# Patient Record
Sex: Female | Born: 1935 | Race: White | Hispanic: No | Smoking: Never smoker
Health system: Southern US, Community
[De-identification: ages and names within clinical notes are randomized; demographics above are authoritative.]

## PROBLEM LIST (undated history)

## (undated) DIAGNOSIS — N950 Postmenopausal bleeding: Secondary | ICD-10-CM

## (undated) DIAGNOSIS — E785 Hyperlipidemia, unspecified: Secondary | ICD-10-CM

## (undated) DIAGNOSIS — C50919 Malignant neoplasm of unspecified site of unspecified female breast: Secondary | ICD-10-CM

## (undated) DIAGNOSIS — Q159 Congenital malformation of eye, unspecified: Secondary | ICD-10-CM

## (undated) DIAGNOSIS — IMO0001 Reserved for inherently not codable concepts without codable children: Secondary | ICD-10-CM

## (undated) DIAGNOSIS — I4891 Unspecified atrial fibrillation: Secondary | ICD-10-CM

## (undated) DIAGNOSIS — Z9221 Personal history of antineoplastic chemotherapy: Secondary | ICD-10-CM

## (undated) DIAGNOSIS — C55 Malignant neoplasm of uterus, part unspecified: Secondary | ICD-10-CM

## (undated) DIAGNOSIS — I10 Essential (primary) hypertension: Secondary | ICD-10-CM

## (undated) DIAGNOSIS — E079 Disorder of thyroid, unspecified: Secondary | ICD-10-CM

## (undated) DIAGNOSIS — IMO0002 Reserved for concepts with insufficient information to code with codable children: Secondary | ICD-10-CM

## (undated) HISTORY — DX: Hyperlipidemia, unspecified: E78.5

## (undated) HISTORY — DX: Essential (primary) hypertension: I10

## (undated) HISTORY — DX: Malignant neoplasm of uterus, part unspecified: C55

## (undated) HISTORY — DX: Malignant neoplasm of unspecified site of unspecified female breast: C50.919

## (undated) HISTORY — PX: ABDOMINAL HYSTERECTOMY: SHX81

## (undated) HISTORY — DX: Disorder of thyroid, unspecified: E07.9

## (undated) HISTORY — DX: Postmenopausal bleeding: N95.0

---

## 1964-07-10 HISTORY — PX: TUBAL LIGATION: SHX77

## 1964-07-10 HISTORY — PX: APPENDECTOMY: SHX54

## 1985-07-10 HISTORY — PX: CATARACT EXTRACTION: SUR2

## 1998-07-10 HISTORY — PX: RETINAL DETACHMENT SURGERY: SHX105

## 1999-07-11 HISTORY — PX: BLADDER REPAIR: SHX76

## 2000-07-10 HISTORY — PX: MASTECTOMY: SHX3

## 2000-07-10 HISTORY — PX: CATARACT EXTRACTION: SUR2

## 2011-09-26 ENCOUNTER — Encounter: Payer: Self-pay | Admitting: Gynecologic Oncology

## 2011-09-27 ENCOUNTER — Encounter: Payer: Self-pay | Admitting: Gynecologic Oncology

## 2011-09-27 ENCOUNTER — Ambulatory Visit: Payer: Medicare Other | Attending: Gynecologic Oncology | Admitting: Gynecologic Oncology

## 2011-09-27 VITALS — BP 142/64 | HR 66 | Temp 98.1°F | Resp 16 | Ht 63.62 in | Wt 147.7 lb

## 2011-09-27 DIAGNOSIS — E079 Disorder of thyroid, unspecified: Secondary | ICD-10-CM | POA: Insufficient documentation

## 2011-09-27 DIAGNOSIS — C549 Malignant neoplasm of corpus uteri, unspecified: Secondary | ICD-10-CM | POA: Insufficient documentation

## 2011-09-27 DIAGNOSIS — Z9010 Acquired absence of unspecified breast and nipple: Secondary | ICD-10-CM | POA: Insufficient documentation

## 2011-09-27 DIAGNOSIS — Z7982 Long term (current) use of aspirin: Secondary | ICD-10-CM | POA: Insufficient documentation

## 2011-09-27 DIAGNOSIS — M810 Age-related osteoporosis without current pathological fracture: Secondary | ICD-10-CM | POA: Insufficient documentation

## 2011-09-27 DIAGNOSIS — C541 Malignant neoplasm of endometrium: Secondary | ICD-10-CM

## 2011-09-27 DIAGNOSIS — Z79899 Other long term (current) drug therapy: Secondary | ICD-10-CM | POA: Insufficient documentation

## 2011-09-27 DIAGNOSIS — I10 Essential (primary) hypertension: Secondary | ICD-10-CM | POA: Insufficient documentation

## 2011-09-27 DIAGNOSIS — E785 Hyperlipidemia, unspecified: Secondary | ICD-10-CM | POA: Insufficient documentation

## 2011-09-27 DIAGNOSIS — Z853 Personal history of malignant neoplasm of breast: Secondary | ICD-10-CM | POA: Insufficient documentation

## 2011-09-27 DIAGNOSIS — C55 Malignant neoplasm of uterus, part unspecified: Secondary | ICD-10-CM | POA: Insufficient documentation

## 2011-09-27 NOTE — Progress Notes (Signed)
Consult Note: Gyn-Onc  Kaylee Pugh 78 y.o. female  CC:  Chief Complaint  Patient presents with  . Endometrial Adenocarcinoma    New pt    HPI: Patient is seen today in consultation at the request of Dr. Baldemar Lenis. Patient is an 78 year old gravida 4 para 4 who began having some bleeding December 23 of the year 2012. She stated at that time that the bleeding was intermittent. She finally sought medical attention for this February 26. An endometrial biopsy was done in the office revealed endometrial cancer. However the specimen was scant and fragmented and they were not able to give her final classification or grating. It is for this reason she is referred to [place] today.  She is otherwise in her usual state of health. She's had significant issues with low back pain. She's had 2 epidural injections for her back pain it it is better. The back pain that she was having a cause radiation of pain down her left leg to the level of her toes. As stated this is better now since an epidural injection. For 3 days she's had some left groin discomfort has been intermittent. She thinks it is positional. She is not sure at what age she transitioned through menopause. She's never taken hormones.  Interval History:   Review of Systems: She denies any chest pain. She does have shortness of breath going up a long flight of stairs but can easily do it. She occasionally has to rest at the top of stairs. She can complete all of her activities of daily living and is quite independent. She denies any nausea,, fevers, chills, headaches, visual changes. She had an intentional weight loss or weight gain. She denies any change in her bowel or bladder habits.   Current Meds:  Outpatient Encounter Prescriptions as of 09/27/2011  Medication Sig Dispense Refill  . aspirin 81 MG tablet Take 81 mg by mouth daily.      . Calcium Carbonate-Vitamin D (CALCIUM + D) 600-200 MG-UNIT TABS Take 1 tablet by mouth 3 (three) times daily.        Marland Kitchen esomeprazole (NEXIUM) 40 MG capsule Take 40 mg by mouth daily before breakfast.      . levothyroxine (SYNTHROID, LEVOTHROID) 50 MCG tablet Take 50 mcg by mouth daily.      Marland Kitchen losartan-hydrochlorothiazide (HYZAAR) 50-12.5 MG per tablet Take 1 tablet by mouth daily.      . nebivolol (BYSTOLIC) 10 MG tablet Take 10 mg by mouth daily.      . rosuvastatin (CRESTOR) 5 MG tablet Take 5 mg by mouth daily.        Allergy:  Allergies  Allergen Reactions  . Iodine Swelling and Rash    Iodine contrast    Social Hx:   History   Social History  . Marital Status: Widowed    Spouse Name: N/A    Number of Children: N/A  . Years of Education: N/A   Occupational History  . Not on file.   Social History Main Topics  . Smoking status: Never Smoker   . Smokeless tobacco: Not on file  . Alcohol Use: No  . Drug Use: No  . Sexually Active:    Other Topics Concern  . Not on file   Social History Narrative  . No narrative on file    Past Surgical Hx:  Consistent of a bladder tacking for urinary incontinence. She is a gravida 4 para 4. At mastectomy for an early stage breast cancer in 2002  and did not require any additional chemotherapy radiation or adjuvant hormonal management. Past Surgical History  Procedure Date  . Mastectomy 2002  . Cataract extraction 1987    Left eye  . Cataract extraction 2002    Right eye  . Retinal detachment surgery 2000  . Bladder repair 2001  . Tubal ligation 1966  . Appendectomy 1966    Past Medical Hx:  Past Medical History  Diagnosis Date  . Breast cancer   . Hypertension   . Hyperlipidemia   . Thyroid disease   . Osteoporosis   . Post-menopausal bleeding     Family Hx:  Family History  Problem Relation Age of Onset  . Diabetes Mother   . Heart disease Father   . Kidney disease Sister     Vitals:  Blood pressure 142/64, pulse 66, temperature 98.1 F (36.7 C), resp. rate 16, height 5' 3.62" (1.616 m), weight 147 lb 11.2 oz (66.996  kg).  Physical Exam: Nourished well developed elderly female in no acute distress. Patient appears younger than stated age.  Neck: No lymphadenopathy no thyromegaly.  Lungs: Clear to auscultation bilaterally.  Cardiovascular: Regular rate and rhythm.  Abdomen: Well-healed infraumbilical vertical incision. There is no evidence of an incisional hernia. She is a visible 2 cm umbilical hernia that is not tender and reducible. There are no masses or nodularity.  Groins: No lymphadenopathy.  Extremities: No edema.  Pelvic: External genitalia within normal limits no atrophic. The vagina is markedly atrophic. The cervix is small there are no visible lesions. The cervix is somewhat deviated towards her side. The purposes of normal size shape and consistency. It is deviated towards the right. There is no nodularity. There are no adnexal masses. Rectal confirms. Exam is limited by patient's tolerance.  Assessment/Plan: 78 year old with ungraded unknown histology of endometrial carcinoma. Spent at least 30 minutes face to face time with the patient and her family discussing what needed to be done with regards to hysterectomy. They understand that she'll need a hysterectomy with removal of the uterus, cervix, tubes, and ovaries. They understand that the uterus will be sent for frozen section to determine the need for any additional procedures. We worked through many dates for surgery both at [facility] and here in [place]. Ultimately they would like to have her surgery on March 29 with Dr. Kyla Balzarine at [facility] due to  many family obligations that are upcoming.  They would to have the surgery done robotic clinic she is a fine candidate for robotic surgery. Risks of surgery were discussed with the patient and her family and included but are not limited to injury to surrounding organs, bleeding, infection, and need for additional procedures. We also discussed thromboembolic disease and the possible need for  Lovenox injections should she have a lymphadenectomy. We discussed thromboembolic disease. She would like to have umbilical hernia repaired at the same time and that should not be an issue. She is scheduled for preop visit on March 25 this would at [facility]. The opportunity to see Dr. Kyla Balzarine at that time. Their numerous questions were elicited in answer to their satisfaction. They can contact us if they have any additional questions.  Mikita Lesmeister A., MD 09/27/2011, 12:07 PM

## 2011-09-27 NOTE — Patient Instructions (Signed)
RTC in [place] for pre-op visit.

## 2011-09-28 ENCOUNTER — Encounter: Payer: Self-pay | Admitting: *Deleted

## 2011-09-28 NOTE — Progress Notes (Signed)
Records faxed to [facility] for pre-op 10/02/11 and OR 10/06/11

## 2011-11-06 ENCOUNTER — Telehealth: Payer: Self-pay | Admitting: Oncology

## 2011-11-06 NOTE — Telephone Encounter (Signed)
Referred by Dr. Kyla Balzarine Dx- Endometrial Ca

## 2011-11-09 ENCOUNTER — Encounter: Payer: Self-pay | Admitting: *Deleted

## 2011-11-09 ENCOUNTER — Other Ambulatory Visit: Payer: Medicare Other

## 2011-11-10 ENCOUNTER — Other Ambulatory Visit (HOSPITAL_BASED_OUTPATIENT_CLINIC_OR_DEPARTMENT_OTHER): Payer: Medicare Other | Admitting: Lab

## 2011-11-10 ENCOUNTER — Ambulatory Visit: Payer: Medicare Other

## 2011-11-10 ENCOUNTER — Telehealth: Payer: Self-pay | Admitting: Oncology

## 2011-11-10 ENCOUNTER — Other Ambulatory Visit: Payer: Self-pay | Admitting: Medical Oncology

## 2011-11-10 ENCOUNTER — Ambulatory Visit (HOSPITAL_BASED_OUTPATIENT_CLINIC_OR_DEPARTMENT_OTHER): Payer: Medicare Other | Admitting: Oncology

## 2011-11-10 VITALS — BP 175/92 | HR 82 | Temp 97.5°F | Ht 63.62 in | Wt 145.8 lb

## 2011-11-10 DIAGNOSIS — C55 Malignant neoplasm of uterus, part unspecified: Secondary | ICD-10-CM

## 2011-11-10 DIAGNOSIS — R112 Nausea with vomiting, unspecified: Secondary | ICD-10-CM

## 2011-11-10 DIAGNOSIS — C541 Malignant neoplasm of endometrium: Secondary | ICD-10-CM

## 2011-11-10 DIAGNOSIS — G629 Polyneuropathy, unspecified: Secondary | ICD-10-CM

## 2011-11-10 DIAGNOSIS — C50919 Malignant neoplasm of unspecified site of unspecified female breast: Secondary | ICD-10-CM

## 2011-11-10 DIAGNOSIS — G609 Hereditary and idiopathic neuropathy, unspecified: Secondary | ICD-10-CM

## 2011-11-10 DIAGNOSIS — E222 Syndrome of inappropriate secretion of antidiuretic hormone: Secondary | ICD-10-CM

## 2011-11-10 DIAGNOSIS — F411 Generalized anxiety disorder: Secondary | ICD-10-CM

## 2011-11-10 DIAGNOSIS — E039 Hypothyroidism, unspecified: Secondary | ICD-10-CM

## 2011-11-10 LAB — CBC WITH DIFFERENTIAL/PLATELET
BASO%: 0.3 % (ref 0.0–2.0)
LYMPH%: 16.4 % (ref 14.0–49.7)
MCHC: 32.5 g/dL (ref 31.5–36.0)
MONO#: 0.6 10*3/uL (ref 0.1–0.9)
RBC: 3.82 10*6/uL (ref 3.70–5.45)
WBC: 7.3 10*3/uL (ref 3.9–10.3)
lymph#: 1.2 10*3/uL (ref 0.9–3.3)
nRBC: 0 % (ref 0–0)

## 2011-11-10 LAB — COMPREHENSIVE METABOLIC PANEL
ALT: 13 U/L (ref 0–35)
AST: 20 U/L (ref 0–37)
Alkaline Phosphatase: 67 U/L (ref 39–117)
Calcium: 9.4 mg/dL (ref 8.4–10.5)
Chloride: 102 mEq/L (ref 96–112)
Creatinine, Ser: 0.9 mg/dL (ref 0.50–1.10)
Potassium: 4.4 mEq/L (ref 3.5–5.3)

## 2011-11-10 MED ORDER — LORAZEPAM 0.5 MG PO TABS: 0.5000 mg | ORAL_TABLET | Freq: Four times a day (QID) | ORAL | Status: AC | PRN

## 2011-11-10 MED ORDER — DEXAMETHASONE 4 MG PO TABS: 4.0000 mg | ORAL_TABLET | Freq: Two times a day (BID) | ORAL | Status: AC

## 2011-11-10 MED ORDER — ONDANSETRON HCL 8 MG PO TABS: 8.0000 mg | ORAL_TABLET | Freq: Two times a day (BID) | ORAL | Status: AC | PRN

## 2011-11-10 NOTE — Patient Instructions (Addendum)
First chemotherapy with taxotere/ carboplatin will be Wednesday May 15. On Tuesday May 14, you should take decadron (dexamethasone, steroid) two of the 4 mg tablets (= 8 mg) in AM with food and two of the 4 mg tablets (=8 mg) late afternoon with food. On Wed May 15 and on Thurs May 16, you should take decadron two tablets each AM and two tablets late afternoon, each dose with food.  (The steroids might make you more energetic, hungrier, more talkative, sometimes more anxious, sometimes harder to sleep, may help arthritis. Try not to be extremely active even if you want to! Often people are more tired for a couple of days when they stop the steroids.)  On Wednesday night, whether or not you are nauseated, take ativan (lorazepam) at bedtime. This may also help you sleep. On Thursday morning, whether or not you are nauseated, take zofran (ondansetron) 8 mg in AM. This will not make you drowsy.  We will call in prescriptions to [facility] for   1.zofran (ondansetron) 8 mg: 1-2 every 12 hrs as needed for nausea. This will not make you drowsy. Occasionally people get headaches from this medicine --  Call Dr Darrold Span if so. 2.ativan (lorazepam) 0.5 mg: 1 every 6 -8 hrs as needed for nausea. Can dissolve under tongue or swallow. Will make you drowsy and sometimes forgetful around the time that you take the dose. 3.decadron (dexamethasone, steroid) 4 mg.  2 tablets (= 8 mg ) AM and late afternoon for 3 days, beginning day prior to chemo. Each dose with food. This helps prevent fluid retention with taxotere.   Call if any questions or concerns  [phone]

## 2011-11-10 NOTE — Telephone Encounter (Signed)
gave patient appointment for 11-2011 thru 12-05-2011 printed calendar and gave to the patient

## 2011-11-10 NOTE — Telephone Encounter (Signed)
Called in ativan , zofran and decadron to pt pharmacy.

## 2011-11-10 NOTE — Telephone Encounter (Signed)
Called pt appt for 11/22/11 lab and chemo advised pt to calendar appt on 5/15 due to new appts scheduled, emailed Marcelino Duster regarding time of chemo on 5/15

## 2011-11-10 NOTE — Telephone Encounter (Deleted)
gave patient appointment for 11-2011 thru 12-05-2011 printed calendar and gave to the patient 

## 2011-11-11 ENCOUNTER — Encounter: Payer: Self-pay | Admitting: Oncology

## 2011-11-11 ENCOUNTER — Other Ambulatory Visit: Payer: Self-pay | Admitting: Oncology

## 2011-11-11 DIAGNOSIS — G629 Polyneuropathy, unspecified: Secondary | ICD-10-CM | POA: Insufficient documentation

## 2011-11-11 DIAGNOSIS — E222 Syndrome of inappropriate secretion of antidiuretic hormone: Secondary | ICD-10-CM | POA: Insufficient documentation

## 2011-11-11 DIAGNOSIS — E039 Hypothyroidism, unspecified: Secondary | ICD-10-CM | POA: Insufficient documentation

## 2011-11-11 DIAGNOSIS — M810 Age-related osteoporosis without current pathological fracture: Secondary | ICD-10-CM | POA: Insufficient documentation

## 2011-11-11 DIAGNOSIS — C50919 Malignant neoplasm of unspecified site of unspecified female breast: Secondary | ICD-10-CM | POA: Insufficient documentation

## 2011-11-11 NOTE — Progress Notes (Signed)
[facility] NEW PATIENT CONSULTATION   Name: Amma Crear Date:76/09/2009 MRN: [id number] DOB: [id number]  REFERRING PHYSICIAN:John Soper Physicians: Lorelei Pont ([place] Marrian Salvage, Gennie Alma), [place] (cardiology, [place]). [facility])  REASON FOR REFERRAL: recently diagnosed uterine carcinosarcoma, for consideration of adjuvant chemotherapy.She is accompanied by 2 sons today.  Patient has developed vaginal spotting in Dec 2012, however was having orthopedic back problems then so delayed gyn evaluation until she had two injections for back. She saw gynecologist in Feb 2013, with endometrial biopsy revealing endometrial cancer, tho specimen was scant. She was seen in consultation by Dr.Paola Duard Brady 09-27-2011 and went to surgery by Dr.Soper at [facility] 10-06-2011, which was robotic hysterectomy/BSO/ bilateral pelvic lymphadenectomy, tho physician was unable to visualize periaortic nodes. [facility] [phone]) from 10-06-11 showed carcinosarcoma, heterologous type with rhabdomyosarcomatous differentiation, FIGO grade 3, near transmural invasion in anterior and posterior lower uterine segment (less than 1 mm from serosal surface). 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 She saw Dr Kyla Balzarine in follow up 10-23-2011 and is scheduled back to him July 22. With high risk for recurrence in upper abdomen and for distant recurrence, Dr Kyla Balzarine has recommended taxane + carboplatin given in sandwich fashion with RT. Patient and family prefer chemotherapy in [place], but would like RT closer to [place] if possible. She has not yet been referred to RT, but may want this in [place]. She attended chemotherapy education class prior to this visit. RN has done teaching on taxotere today and reviewed the carboplatin information. (See  below, taxotere to be used in preference to taxol due to preexisting peripheral neuropathy). She has had no imaging in [place] system, none mentioned in Dr.Gehrig's initial consultation note and none accompanies information from [facility].  Patient has been recovering without significant problems since she has been back at home. She is still limiting activity, including not going upstairs in her home, and still has some low abdominal discomfort for which she has used occasional ibuprofen. She has had slight vaginal itching improved with monistat cream, no fever, no clear dysuria, an ulcer in mouth for which she is using Biotene, and a couple of episodes of shortness of breath not associated with any chest pain. She has had some anxiety, does have prn xanax which she has used previously. She had some swelling LLE transiently after DC home. She was on lovenox in hospital, did not continue this at DC. She has been eating and initial constipation has resolved, not presently using laxatives tho she drinks prune juice. She has had numbness in fingers and feet x several years, which occasionally causes her to drop things and makes writing difficult. . IV access was somewhat difficult during hospitalization, however she still prefers this to [facility] if possible. Note we may be able to use LUE if she did not have axillary node dissection in 2002.  REVIEW OF SYSTEMS:very active prior to surgery. Weight down 6 lbs around surgery. No HA tho does have posterior neck pain thought arthritis. Decreased vision in left eye from retinal problem (20/80), no known hearing problems, breathing otherwise fine without cough or wheeze, bladder ok, tolerating regular diet, no nausea/vomiting. No known dental problems, up to date on cleanings (one son is Education officer, community). Due for mammograms which were delayed with this diagnosis. No noted changes in right breast or left mastectomy scar. No swelling  LUE. No other bleeding. Remainder of 10 point Review  of Systems negative.  ALLERGIES: Iodine including contrast, causing hives  PAST MEDICAL HISTORY: G4P4 , BTL 1968. Appendectomy 1996. Umbilical hernia repair. Bilateral cataract surgery and repair of retinal detachment 2000. Left mastectomy for early stage breast cancer 2002 possibly without axillary node evaluation, this at [facility] by Dr.McGinn (retired); no RT and no systemic treatment after surgery. Mammograms at [facility] overdue as above. Some procedure on urethra remotely. Osteoporosis with scoliosis and recent L4L5 disc problems. Hypothyroid, HTN, elevated lipids, osteoarthritis.   CURRENT MEDICATIONS:reviewed in EMR and include ASA 81, Ca + D, nexium, synthroid, Hyzaar, Crestor, Bystolic. I have given new prescriptions for decadron 8 mg bid x 3 days beginning day prior to taxotere #12, zofran and ativan 0.5 mg to use 1/2 - 1 q 6 hrs prn. She additionally has xanax and ibuprofen not listed. We have discussed taking each dose of steroids and of ibuprofen with food.   SOCIAL HISTORY: From [place], has lived most of her life in [place]. Widowed since 1989. 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 No tobacco or ETOH or drugs. Sons in [place], [place], [place] and [place]. Transportation to [place] better any day other than Mon. 6 grandchildren/ 2 step-grandchildren.   FAMILY HISTORY: family history includes Diabetes in her mother; Heart disease in her father; and Kidney disease in her sister. Brother with dementia, sons healthy. No cancer including gyn or breast in family   LABORATORY DATA:  Results for orders placed in visit on 76/03/13 (from the past 48 hour(s))  CBC WITH DIFFERENTIAL     Status: Abnormal   Collection Time   11/10/11  1:20 PM      Component Value Range Comment   WBC 7.3  3.9 - 10.3 (10e3/uL)    NEUT# 5.3  1.5 - 6.5 (10e3/uL)    HGB 11.0 (*) 11.6 - 15.9 (g/dL)    HCT 62.1 (*) 30.8 - 46.6 (%)    Platelets 225  145 - 400 (10e3/uL)    MCV  88.4  79.5 - 101.0 (fL)    MCH 28.7  25.1 - 34.0 (pg)    MCHC 32.5  31.5 - 36.0 (g/dL)    RBC 6.57  8.46 - 9.62 (10e6/uL)    RDW 14.1  11.2 - 14.5 (%)    lymph# 1.2  0.9 - 3.3 (10e3/uL)    MONO# 0.6  0.1 - 0.9 (10e3/uL)    Eosinophils Absolute 0.2  0.0 - 0.5 (10e3/uL)    Basophils Absolute 0.0  0.0 - 0.1 (10e3/uL)    NEUT% 72.3  38.4 - 76.8 (%)    LYMPH% 16.4  14.0 - 49.7 (%)    MONO% 8.7  0.0 - 14.0 (%)    EOS% 2.3  0.0 - 7.0 (%)    BASO% 0.3  0.0 - 2.0 (%)    nRBC 0  0 - 0 (%)   COMPREHENSIVE METABOLIC PANEL     Status: Abnormal   Collection Time   11/10/11  1:20 PM      Component Value Range Comment   Sodium 136  135 - 145 (mEq/L)    Potassium 4.4  3.5 - 5.3 (mEq/L)    Chloride 102  96 - 112 (mEq/L)    CO2 25  19 - 32 (mEq/L)    Glucose, Bld 116 (*) 70 - 99 (mg/dL)    BUN 18  6 - 23 (mg/dL)    Creatinine, Ser 9.52  0.50 -  1.10 (mg/dL)    Total Bilirubin 0.5  0.3 - 1.2 (mg/dL)    Alkaline Phosphatase 67  39 - 117 (U/L)    AST 20  0 - 37 (U/L)    ALT 13  0 - 35 (U/L)    Total Protein 6.2  6.0 - 8.3 (g/dL)    Albumin 4.1  3.5 - 5.2 (g/dL)    Calcium 9.4  8.4 - 10.5 (mg/dL)        RADIOGRAPHY: No results found.         PHYSICAL EXAM:  height is 5' 3.62" (1.616 m) and weight is 145 lb 12.8 oz (66.134 kg). Her oral temperature is 97.5 F (36.4 C). Her blood pressure is 175/92 and her pulse is 82. Pleasant lady looks younger than stated age. Ambulatory without assistance, a little uncomfortable seated in exam room. Alert, fully oriented and appropriate, good historian, sons very supportive. HEENT: normal hair pattern. PERRL, scleral bleed left lateral from recent ophth procedure. Oral mucosa moist and clear. No JVD or apparent thyroid masses. Lymphatics: no cervical, supraclavicular, axillary, inguinal adenopathy Lungs clear to auscultation and percussion. Left mastectomy scar well-healed with no findings of concern for local recurrence. Left axilla not remarkable, no  swelling LUE. Right breast without dominant mass, skin or nipple findings and right axilla/UE not remarkable. Heart RRR without gallop, clear heart sounds Abdomen soft, not clearly distended, small umbilical hernia reduces easily. No appreciable HSM. Midline incision well-healed. Some normal BS. No inguinal adenopathy. LE without CCE. Neuro nonfocal Skin without rash or ecchymoses.   We have discussed circumstances surrounding her diagnosis and the surgery that was done, as well as recommendations for chemotherapy and RT. We have discussed chemotherapy  Mechanism of action and side effects in general, as well as the antiemetics and steroids. We have discussed outpatient administration of chemotherapy and they understand that they can contact office at any time if questions or concerns. They understand that she will need to be seen between the q 3 week treatments. They are considering location for RT. Her grandson is graduating from college next week, so that she wants to begin chemotherapy the following week.   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 We anticipate RT after 3 cycles, then additional 3 cycles of chemotherapy. First treatment will be 11-22-2011 with repeat CBC that day. I will see her with CBC at least on 5-22 and with CBC/CMET on 5-29. She will use decadron around taxotere, ativan night of RX and prn, zofran am after RX and prn. 2.peripheral neuropathy preexsiting as above 3.degenerative arthritis and disc disease 4. Early stage left breast cancer 2002 not known active. Son plans to try to get records from [facility] re ? Axillary nodes. 5.hypothyroidism, HTN,, retinal problems, elevated lipids, surgeries as above.  Patient and her sons were comfortable with discussion and in agreement with plan.   Seiji Wiswell  P, MD 11/11/2011 11:41 AM

## 2011-11-11 NOTE — Assessment & Plan Note (Signed)
III

## 2011-11-13 ENCOUNTER — Telehealth: Payer: Self-pay | Admitting: *Deleted

## 2011-11-13 NOTE — Telephone Encounter (Signed)
Per staff message from [place], I have adjusted the treatment appt for 5/15 per order. Rose aware appt changed. JMW

## 2011-11-14 ENCOUNTER — Other Ambulatory Visit: Payer: Self-pay | Admitting: Oncology

## 2011-11-15 ENCOUNTER — Telehealth: Payer: Self-pay | Admitting: Oncology

## 2011-11-15 ENCOUNTER — Telehealth: Payer: Self-pay

## 2011-11-15 ENCOUNTER — Telehealth: Payer: Self-pay | Admitting: *Deleted

## 2011-11-15 NOTE — Telephone Encounter (Signed)
Spoke with Kaylee Pugh and told her that she will be receiving a Neulasta injection the day after chemotherapy to help keep her white blood cell count recover post chemotherapy.  Reviewed how it can cause lower back/sternal pain since bone marrow predominately  produced in these bones.  Claritin daily helps minimize the aches.  She may use tylenol and if necessary the hydrocodone that she states she has on hand from some back problems at the beginning of the year. Our schedulers will call her with an appt. Time for the injection for 11-23-11. Pt. Verbalized understanding.

## 2011-11-15 NOTE — Telephone Encounter (Signed)
Talked to pt, gave her appt for neulasta on 5/16th , also advised pt to get appt calendar on 5/15th

## 2011-11-15 NOTE — Telephone Encounter (Signed)
Per staff message from Rose,I have scheduled treatment appts. Appts in computer and Rose aware.  JMW

## 2011-11-16 ENCOUNTER — Telehealth: Payer: Self-pay | Admitting: *Deleted

## 2011-11-16 NOTE — Telephone Encounter (Signed)
Patient called to give fax # to medical records department at [facility] to obtain the operative note and path on her June 2012 mastectomy. MD wants to determine status of lymph nodes.  [phone]. Forwarded this information to HIM

## 2011-11-16 NOTE — Telephone Encounter (Signed)
Call from son, Queena Monrreal who is POA requests he be the point of contact for his mother's care. When she gets too many calls she gets anxious and can get facts mixed up. Please only call him and then he can relay information to her. Forwarded this request to registration to make note in chart.

## 2011-11-20 ENCOUNTER — Telehealth: Payer: Self-pay

## 2011-11-20 NOTE — Telephone Encounter (Signed)
Jeanette left a message stating that the records on the patient's mastectomy will be pulled late tomorrow.  She will fax the records when received.

## 2011-11-22 ENCOUNTER — Telehealth: Payer: Self-pay

## 2011-11-22 ENCOUNTER — Other Ambulatory Visit (HOSPITAL_BASED_OUTPATIENT_CLINIC_OR_DEPARTMENT_OTHER): Payer: Medicare Other | Admitting: Lab

## 2011-11-22 ENCOUNTER — Ambulatory Visit (HOSPITAL_BASED_OUTPATIENT_CLINIC_OR_DEPARTMENT_OTHER): Payer: Medicare Other

## 2011-11-22 ENCOUNTER — Other Ambulatory Visit: Payer: Self-pay

## 2011-11-22 VITALS — BP 152/69 | HR 58 | Temp 96.7°F

## 2011-11-22 DIAGNOSIS — C55 Malignant neoplasm of uterus, part unspecified: Secondary | ICD-10-CM

## 2011-11-22 DIAGNOSIS — C541 Malignant neoplasm of endometrium: Secondary | ICD-10-CM

## 2011-11-22 DIAGNOSIS — C549 Malignant neoplasm of corpus uteri, unspecified: Secondary | ICD-10-CM

## 2011-11-22 DIAGNOSIS — Z5111 Encounter for antineoplastic chemotherapy: Secondary | ICD-10-CM

## 2011-11-22 LAB — CBC WITH DIFFERENTIAL/PLATELET
Basophils Absolute: 0 10*3/uL (ref 0.0–0.1)
EOS%: 0 % (ref 0.0–7.0)
HCT: 30.5 % — ABNORMAL LOW (ref 34.8–46.6)
HGB: 10.2 g/dL — ABNORMAL LOW (ref 11.6–15.9)
LYMPH%: 12.8 % — ABNORMAL LOW (ref 14.0–49.7)
MCH: 27.9 pg (ref 25.1–34.0)
MCV: 83.6 fL (ref 79.5–101.0)
MONO%: 11.1 % (ref 0.0–14.0)
NEUT%: 76 % (ref 38.4–76.8)
Platelets: 201 10*3/uL (ref 145–400)

## 2011-11-22 MED ORDER — SODIUM CHLORIDE 0.9 % IV SOLN
290.8000 mg | Freq: Once | INTRAVENOUS | Status: AC
  Administered 2011-11-22: 290 mg via INTRAVENOUS
  Filled 2011-11-22: qty 29

## 2011-11-22 MED ORDER — ONDANSETRON 16 MG/50ML IVPB (CHCC)
16.0000 mg | Freq: Once | INTRAVENOUS | Status: AC
  Administered 2011-11-22: 16 mg via INTRAVENOUS

## 2011-11-22 MED ORDER — SODIUM CHLORIDE 0.9 % IV SOLN
Freq: Once | INTRAVENOUS | Status: AC
  Administered 2011-11-22: 10:00:00 via INTRAVENOUS

## 2011-11-22 MED ORDER — DOCETAXEL CHEMO INJECTION 160 MG/16ML
60.0000 mg/m2 | Freq: Once | INTRAVENOUS | Status: AC
  Administered 2011-11-22: 100 mg via INTRAVENOUS
  Filled 2011-11-22: qty 10

## 2011-11-22 MED ORDER — HEPARIN SOD (PORK) LOCK FLUSH 100 UNIT/ML IV SOLN
500.0000 [IU] | Freq: Once | INTRAVENOUS | Status: DC | PRN
  Filled 2011-11-22: qty 5

## 2011-11-22 MED ORDER — SODIUM CHLORIDE 0.9 % IJ SOLN
10.0000 mL | INTRAMUSCULAR | Status: DC | PRN
  Filled 2011-11-22: qty 10

## 2011-11-22 MED ORDER — DEXAMETHASONE SODIUM PHOSPHATE 4 MG/ML IJ SOLN
20.0000 mg | Freq: Once | INTRAMUSCULAR | Status: AC
  Administered 2011-11-22: 20 mg via INTRAVENOUS

## 2011-11-22 NOTE — Patient Instructions (Signed)
[facility] Discharge Instructions for Patients Receiving Chemotherapy  Today you received the following chemotherapy agents Taxotere and Carboplatin  To help prevent nausea and vomiting after your treatment, we encourage you to take your nausea medication Begin taking it at 7 pm and take it as often as prescribed for the next 24 to 72 hours.   If you develop nausea and vomiting that is not controlled by your nausea medication, call the clinic. If it is after clinic hours your family physician or the after hours number for the clinic or go to the Emergency Department.   BELOW ARE SYMPTOMS THAT SHOULD BE REPORTED IMMEDIATELY:  *FEVER GREATER THAN 100.5 F  *CHILLS WITH OR WITHOUT FEVER  NAUSEA AND VOMITING THAT IS NOT CONTROLLED WITH YOUR NAUSEA MEDICATION  *UNUSUAL SHORTNESS OF BREATH  *UNUSUAL BRUISING OR BLEEDING  TENDERNESS IN MOUTH AND THROAT WITH OR WITHOUT PRESENCE OF ULCERS  *URINARY PROBLEMS  *BOWEL PROBLEMS  UNUSUAL RASH Items with * indicate a potential emergency and should be followed up as soon as possible.  One of the nurses will contact you 24 hours after your treatment. Please let the nurse know about any problems that you may have experienced. Feel free to call the clinic you have any questions or concerns. The clinic phone number is [phone].   I have been informed and understand all the instructions given to me. I know to contact the clinic, my physician, or go to the Emergency Department if any problems should occur. I do not have any questions at this time, but understand that I may call the clinic during office hours   should I have any questions or need assistance in obtaining follow up care.    __________________________________________  _____________  __________ Signature of Patient or Authorized Representative            Date                   Time    __________________________________________ Nurse's Signature  Docetaxel  injection What is this medicine? DOCETAXEL (doe se TAX el) is a chemotherapy drug. It targets fast dividing cells, like cancer cells, and causes these cells to die. This medicine is used to treat many types of cancers like breast cancer, certain stomach cancers, head and neck cancer, lung cancer, and prostate cancer. This medicine may be used for other purposes; ask your health care provider or pharmacist if you have questions. What should I tell my health care provider before I take this medicine? They need to know if you have any of these conditions: -infection (especially a virus infection such as chickenpox, cold sores, or herpes) -liver disease -low blood counts, like low white cell, platelet, or red cell counts -an unusual or allergic reaction to docetaxel, polysorbate 80, other chemotherapy agents, other medicines, foods, dyes, or preservatives -pregnant or trying to get pregnant -breast-feeding How should I use this medicine? This drug is given as an infusion into a vein. It is administered in a hospital or clinic by a specially trained health care professional. Talk to your pediatrician regarding the use of this medicine in children. Special care may be needed. Overdosage: If you think you have taken too much of this medicine contact a poison control center or emergency room at once. NOTE: This medicine is only for you. Do not share this medicine with others. What if I miss a dose? It is important not to miss your dose. Call your doctor or health care  professional if you are unable to keep an appointment. What may interact with this medicine? -cyclosporine -erythromycin -ketoconazole -medicines to increase blood counts like filgrastim, pegfilgrastim, sargramostim -vaccines Talk to your doctor or health care professional before taking any of these medicines: -acetaminophen -aspirin -ibuprofen -ketoprofen -naproxen This list may not describe all possible interactions. Give your  health care provider a list of all the medicines, herbs, non-prescription drugs, or dietary supplements you use. Also tell them if you smoke, drink alcohol, or use illegal drugs. Some items may interact with your medicine. What should I watch for while using this medicine? Your condition will be monitored carefully while you are receiving this medicine. You will need important blood work done while you are taking this medicine. This drug may make you feel generally unwell. This is not uncommon, as chemotherapy can affect healthy cells as well as cancer cells. Report any side effects. Continue your course of treatment even though you feel ill unless your doctor tells you to stop. In some cases, you may be given additional medicines to help with side effects. Follow all directions for their use. Call your doctor or health care professional for advice if you get a fever, chills or sore throat, or other symptoms of a cold or flu. Do not treat yourself. This drug decreases your body's ability to fight infections. Try to avoid being around people who are sick. This medicine may increase your risk to bruise or bleed. Call your doctor or health care professional if you notice any unusual bleeding. Be careful brushing and flossing your teeth or using a toothpick because you may get an infection or bleed more easily. If you have any dental work done, tell your dentist you are receiving this medicine. Avoid taking products that contain aspirin, acetaminophen, ibuprofen, naproxen, or ketoprofen unless instructed by your doctor. These medicines may hide a fever. Do not become pregnant while taking this medicine. Women should inform their doctor if they wish to become pregnant or think they might be pregnant. There is a potential for serious side effects to an unborn child. Talk to your health care professional or pharmacist for more information. Do not breast-feed an infant while taking this medicine. What side effects  may I notice from receiving this medicine? Side effects that you should report to your doctor or health care professional as soon as possible: -allergic reactions like skin rash, itching or hives, swelling of the face, lips, or tongue -low blood counts - This drug may decrease the number of white blood cells, red blood cells and platelets. You may be at increased risk for infections and bleeding. -signs of infection - fever or chills, cough, sore throat, pain or difficulty passing urine -signs of decreased platelets or bleeding - bruising, pinpoint red spots on the skin, black, tarry stools, nosebleeds -signs of decreased red blood cells - unusually weak or tired, fainting spells, lightheadedness -breathing problems -fast or irregular heartbeat -low blood pressure -mouth sores -nausea and vomiting -pain, swelling, redness or irritation at the injection site -pain, tingling, numbness in the hands or feet -swelling of the ankle, feet, hands -weight gain Side effects that usually do not require medical attention (report to your prescriber or health care professional if they continue or are bothersome): -bone pain -complete hair loss including hair on your head, underarms, pubic hair, eyebrows, and eyelashes -diarrhea -excessive tearing -changes in the color of fingernails -loosening of the fingernails -nausea -muscle pain -red flush to skin -sweating -weak or tired This  list may not describe all possible side effects. Call your doctor for medical advice about side effects. You may report side effects to FDA at 1-800-FDA-1088. Where should I keep my medicine? This drug is given in a hospital or clinic and will not be stored at home. NOTE: This sheet is a summary. It may not cover all possible information. If you have questions about this medicine, talk to your doctor, pharmacist, or health care provider.  2012, [employer] Standard. (06/08/2008 11:52:10 AM)  Carboplatin  injection What is this medicine? CARBOPLATIN (KAR boe pla tin) is a chemotherapy drug. It targets fast dividing cells, like cancer cells, and causes these cells to die. This medicine is used to treat ovarian cancer and many other cancers. This medicine may be used for other purposes; ask your health care provider or pharmacist if you have questions. What should I tell my health care provider before I take this medicine? They need to know if you have any of these conditions: -blood disorders -hearing problems -kidney disease -recent or ongoing radiation therapy -an unusual or allergic reaction to carboplatin, cisplatin, other chemotherapy, other medicines, foods, dyes, or preservatives -pregnant or trying to get pregnant -breast-feeding How should I use this medicine? This drug is usually given as an infusion into a vein. It is administered in a hospital or clinic by a specially trained health care professional. Talk to your pediatrician regarding the use of this medicine in children. Special care may be needed. Overdosage: If you think you have taken too much of this medicine contact a poison control center or emergency room at once. NOTE: This medicine is only for you. Do not share this medicine with others. What if I miss a dose? It is important not to miss a dose. Call your doctor or health care professional if you are unable to keep an appointment. What may interact with this medicine? -medicines for seizures -medicines to increase blood counts like filgrastim, pegfilgrastim, sargramostim -some antibiotics like amikacin, gentamicin, neomycin, streptomycin, tobramycin -vaccines Talk to your doctor or health care professional before taking any of these medicines: -acetaminophen -aspirin -ibuprofen -ketoprofen -naproxen This list may not describe all possible interactions. Give your health care provider a list of all the medicines, herbs, non-prescription drugs, or dietary supplements  you use. Also tell them if you smoke, drink alcohol, or use illegal drugs. Some items may interact with your medicine. What should I watch for while using this medicine? Your condition will be monitored carefully while you are receiving this medicine. You will need important blood work done while you are taking this medicine. This drug may make you feel generally unwell. This is not uncommon, as chemotherapy can affect healthy cells as well as cancer cells. Report any side effects. Continue your course of treatment even though you feel ill unless your doctor tells you to stop. In some cases, you may be given additional medicines to help with side effects. Follow all directions for their use. Call your doctor or health care professional for advice if you get a fever, chills or sore throat, or other symptoms of a cold or flu. Do not treat yourself. This drug decreases your body's ability to fight infections. Try to avoid being around people who are sick. This medicine may increase your risk to bruise or bleed. Call your doctor or health care professional if you notice any unusual bleeding. Be careful brushing and flossing your teeth or using a toothpick because you may get an infection or bleed more  easily. If you have any dental work done, tell your dentist you are receiving this medicine. Avoid taking products that contain aspirin, acetaminophen, ibuprofen, naproxen, or ketoprofen unless instructed by your doctor. These medicines may hide a fever. Do not become pregnant while taking this medicine. Women should inform their doctor if they wish to become pregnant or think they might be pregnant. There is a potential for serious side effects to an unborn child. Talk to your health care professional or pharmacist for more information. Do not breast-feed an infant while taking this medicine. What side effects may I notice from receiving this medicine? Side effects that you should report to your doctor or  health care professional as soon as possible: -allergic reactions like skin rash, itching or hives, swelling of the face, lips, or tongue -signs of infection - fever or chills, cough, sore throat, pain or difficulty passing urine -signs of decreased platelets or bleeding - bruising, pinpoint red spots on the skin, black, tarry stools, nosebleeds -signs of decreased red blood cells - unusually weak or tired, fainting spells, lightheadedness -breathing problems -changes in hearing -changes in vision -chest pain -high blood pressure -low blood counts - This drug may decrease the number of white blood cells, red blood cells and platelets. You may be at increased risk for infections and bleeding. -nausea and vomiting -pain, swelling, redness or irritation at the injection site -pain, tingling, numbness in the hands or feet -problems with balance, talking, walking -trouble passing urine or change in the amount of urine Side effects that usually do not require medical attention (report to your doctor or health care professional if they continue or are bothersome): -hair loss -loss of appetite -metallic taste in the mouth or changes in taste This list may not describe all possible side effects. Call your doctor for medical advice about side effects. You may report side effects to FDA at 1-800-FDA-1088. Where should I keep my medicine? This drug is given in a hospital or clinic and will not be stored at home. NOTE: This sheet is a summary. It may not cover all possible information. If you have questions about this medicine, talk to your doctor, pharmacist, or health care provider.  2012, [employer]/Gold Standard. (10/01/2007 2:38:05 PM)

## 2011-11-22 NOTE — Telephone Encounter (Signed)
Spoke with Mr. Ybanez and told him that [name] can begin the Claritin tomorrow am and use for the next 5-7 days for the neulasta aches. She can take tylenol when she gets home after the injection. If she is experiencing aches that ar not relieved by the tylenol she can use the hydrocodone.  She does not need to take it prophylactic.  Mr. Koenen verbalized Understanding.

## 2011-11-23 ENCOUNTER — Ambulatory Visit (HOSPITAL_BASED_OUTPATIENT_CLINIC_OR_DEPARTMENT_OTHER): Payer: Medicare Other

## 2011-11-23 VITALS — BP 141/74 | HR 73 | Temp 97.4°F

## 2011-11-23 DIAGNOSIS — C549 Malignant neoplasm of corpus uteri, unspecified: Secondary | ICD-10-CM

## 2011-11-23 DIAGNOSIS — C541 Malignant neoplasm of endometrium: Secondary | ICD-10-CM

## 2011-11-23 MED ORDER — PEGFILGRASTIM INJECTION 6 MG/0.6ML
6.0000 mg | Freq: Once | SUBCUTANEOUS | Status: AC
  Administered 2011-11-23: 6 mg via SUBCUTANEOUS
  Filled 2011-11-23: qty 0.6

## 2011-11-23 NOTE — Progress Notes (Signed)
Kaylee Pugh here for Neulasta injection following 1st chemo treatment yesterday.  States is not having any nausea, vomiting, or diarrhea.  Is trying to drink water and eating well.  Encouraged to drink plenty of fluids to flush kidneys.  Reviewed what medication to take for the Neulasta injection.  Claritin 5-7days and Tylenol as needed and Hydrocodone for pain unrelieved with the Tylenol.

## 2011-11-24 NOTE — Progress Notes (Signed)
ENCOUNTER OPENED IN ERROR

## 2011-11-27 ENCOUNTER — Encounter: Payer: Self-pay | Admitting: Oncology

## 2011-11-27 NOTE — Progress Notes (Signed)
Also received from [facility] [place] and [facility] pathology from 12-31-2000 left mastectomy:  ER, PR and HER 2 all negative.

## 2011-11-27 NOTE — Progress Notes (Unsigned)
Operative note from left mastectomy 12-31-2000 and pathology ([id number]) received from [facility] [place] [facility] [place]

## 2011-11-27 NOTE — Progress Notes (Signed)
Operative note and pathology ([id number]) received from [facility] [place] from left mastectomy 12-31-2000. Adipose tissue was removed from axilla with no lymph nodes in the specimen. The breast had 0.8 cm infiltrating ductal carcinoma with multicentric comedo DCIS.

## 2011-11-29 ENCOUNTER — Ambulatory Visit (HOSPITAL_BASED_OUTPATIENT_CLINIC_OR_DEPARTMENT_OTHER): Payer: Medicare Other | Admitting: Oncology

## 2011-11-29 ENCOUNTER — Encounter (HOSPITAL_COMMUNITY): Payer: Self-pay | Admitting: *Deleted

## 2011-11-29 ENCOUNTER — Inpatient Hospital Stay (HOSPITAL_COMMUNITY)
Admission: EM | Admit: 2011-11-29 | Discharge: 2011-12-01 | DRG: 309 | Disposition: A | Discharge status: Home / Self Care | Payer: Medicare Other | Attending: Internal Medicine | Admitting: Internal Medicine

## 2011-11-29 ENCOUNTER — Other Ambulatory Visit (HOSPITAL_BASED_OUTPATIENT_CLINIC_OR_DEPARTMENT_OTHER): Payer: Medicare Other | Admitting: Lab

## 2011-11-29 ENCOUNTER — Emergency Department (HOSPITAL_COMMUNITY): Payer: Medicare Other

## 2011-11-29 ENCOUNTER — Other Ambulatory Visit: Payer: Self-pay

## 2011-11-29 ENCOUNTER — Encounter: Payer: Self-pay | Admitting: Oncology

## 2011-11-29 VITALS — BP 119/77 | HR 74 | Temp 96.7°F | Ht 63.62 in | Wt 145.2 lb

## 2011-11-29 DIAGNOSIS — R197 Diarrhea, unspecified: Secondary | ICD-10-CM | POA: Diagnosis present

## 2011-11-29 DIAGNOSIS — C55 Malignant neoplasm of uterus, part unspecified: Secondary | ICD-10-CM

## 2011-11-29 DIAGNOSIS — Z853 Personal history of malignant neoplasm of breast: Secondary | ICD-10-CM

## 2011-11-29 DIAGNOSIS — E039 Hypothyroidism, unspecified: Secondary | ICD-10-CM | POA: Diagnosis present

## 2011-11-29 DIAGNOSIS — C50919 Malignant neoplasm of unspecified site of unspecified female breast: Secondary | ICD-10-CM

## 2011-11-29 DIAGNOSIS — Q159 Congenital malformation of eye, unspecified: Secondary | ICD-10-CM

## 2011-11-29 DIAGNOSIS — B370 Candidal stomatitis: Secondary | ICD-10-CM | POA: Diagnosis present

## 2011-11-29 DIAGNOSIS — G609 Hereditary and idiopathic neuropathy, unspecified: Secondary | ICD-10-CM | POA: Diagnosis present

## 2011-11-29 DIAGNOSIS — I4891 Unspecified atrial fibrillation: Secondary | ICD-10-CM

## 2011-11-29 DIAGNOSIS — C541 Malignant neoplasm of endometrium: Secondary | ICD-10-CM

## 2011-11-29 DIAGNOSIS — T451X5A Adverse effect of antineoplastic and immunosuppressive drugs, initial encounter: Secondary | ICD-10-CM | POA: Diagnosis present

## 2011-11-29 DIAGNOSIS — D72829 Elevated white blood cell count, unspecified: Secondary | ICD-10-CM | POA: Diagnosis present

## 2011-11-29 DIAGNOSIS — C549 Malignant neoplasm of corpus uteri, unspecified: Secondary | ICD-10-CM | POA: Diagnosis present

## 2011-11-29 DIAGNOSIS — E236 Other disorders of pituitary gland: Secondary | ICD-10-CM | POA: Diagnosis present

## 2011-11-29 DIAGNOSIS — E032 Hypothyroidism due to medicaments and other exogenous substances: Secondary | ICD-10-CM

## 2011-11-29 DIAGNOSIS — E222 Syndrome of inappropriate secretion of antidiuretic hormone: Secondary | ICD-10-CM | POA: Diagnosis present

## 2011-11-29 DIAGNOSIS — E871 Hypo-osmolality and hyponatremia: Secondary | ICD-10-CM

## 2011-11-29 DIAGNOSIS — G629 Polyneuropathy, unspecified: Secondary | ICD-10-CM | POA: Diagnosis present

## 2011-11-29 DIAGNOSIS — R0602 Shortness of breath: Secondary | ICD-10-CM | POA: Diagnosis present

## 2011-11-29 HISTORY — DX: Congenital malformation of eye, unspecified: Q15.9

## 2011-11-29 HISTORY — DX: Reserved for inherently not codable concepts without codable children: IMO0001

## 2011-11-29 LAB — CBC
HCT: 34.9 % — ABNORMAL LOW (ref 36.0–46.0)
MCHC: 33.8 g/dL (ref 30.0–36.0)
Platelets: 149 10*3/uL — ABNORMAL LOW (ref 150–400)
RDW: 13.3 % (ref 11.5–15.5)
WBC: 23.3 10*3/uL — ABNORMAL HIGH (ref 4.0–10.5)

## 2011-11-29 LAB — CBC WITH DIFFERENTIAL/PLATELET
BASO%: 0.1 % (ref 0.0–2.0)
Basophils Absolute: 0 10*3/uL (ref 0.0–0.1)
Eosinophils Absolute: 0 10*3/uL (ref 0.0–0.5)
HCT: 33.3 % — ABNORMAL LOW (ref 34.8–46.6)
HGB: 11 g/dL — ABNORMAL LOW (ref 11.6–15.9)
LYMPH%: 9.2 % — ABNORMAL LOW (ref 14.0–49.7)
MCHC: 32.9 g/dL (ref 31.5–36.0)
MONO#: 1.2 10*3/uL — ABNORMAL HIGH (ref 0.1–0.9)
NEUT%: 83.9 % — ABNORMAL HIGH (ref 38.4–76.8)
Platelets: 124 10*3/uL — ABNORMAL LOW (ref 145–400)
WBC: 18.6 10*3/uL — ABNORMAL HIGH (ref 3.9–10.3)

## 2011-11-29 LAB — DIFFERENTIAL
Band Neutrophils: 0 % (ref 0–10)
Blasts: 0 %
Lymphocytes Relative: 25 % (ref 12–46)
Lymphs Abs: 5.8 10*3/uL — ABNORMAL HIGH (ref 0.7–4.0)
Monocytes Relative: 14 % — ABNORMAL HIGH (ref 3–12)
Promyelocytes Absolute: 0 %
nRBC: 0 /100 WBC

## 2011-11-29 LAB — COMPREHENSIVE METABOLIC PANEL
ALT: 16 U/L (ref 0–35)
AST: 24 U/L (ref 0–37)
CO2: 25 mEq/L (ref 19–32)
Chloride: 88 mEq/L — ABNORMAL LOW (ref 96–112)
Creatinine, Ser: 0.83 mg/dL (ref 0.50–1.10)
GFR calc Af Amer: 72 mL/min — ABNORMAL LOW (ref >90)
GFR calc non Af Amer: 63 mL/min — ABNORMAL LOW (ref >90)
Glucose, Bld: 98 mg/dL (ref 70–99)
Sodium: 123 mEq/L — ABNORMAL LOW (ref 135–145)
Total Bilirubin: 0.3 mg/dL (ref 0.3–1.2)

## 2011-11-29 MED ORDER — METHYLPREDNISOLONE SODIUM SUCC 125 MG IJ SOLR
125.0000 mg | Freq: Once | INTRAMUSCULAR | Status: AC
  Administered 2011-11-29: 125 mg via INTRAVENOUS
  Filled 2011-11-29: qty 2

## 2011-11-29 MED ORDER — DILTIAZEM HCL 100 MG IV SOLR: 5.0000 mg/h | Freq: Once | INTRAVENOUS | Status: DC

## 2011-11-29 MED ORDER — FLUCONAZOLE 100 MG PO TABS: ORAL_TABLET | ORAL | Status: DC

## 2011-11-29 MED ORDER — DILTIAZEM LOAD VIA INFUSION
10.0000 mg | Freq: Once | INTRAVENOUS | Status: AC
  Administered 2011-11-29: 10 mg via INTRAVENOUS
  Filled 2011-11-29: qty 10

## 2011-11-29 MED ORDER — IOHEXOL 300 MG/ML  SOLN
100.0000 mL | Freq: Once | INTRAMUSCULAR | Status: AC | PRN
  Administered 2011-11-29: 80 mL via INTRAVENOUS

## 2011-11-29 MED ORDER — DILTIAZEM HCL 25 MG/5ML IV SOLN: 10.0000 mg | Freq: Once | INTRAVENOUS | Status: DC

## 2011-11-29 MED ORDER — HEPARIN BOLUS VIA INFUSION
1500.0000 [IU] | Freq: Once | INTRAVENOUS | Status: AC
  Administered 2011-11-30: 1500 [IU] via INTRAVENOUS

## 2011-11-29 MED ORDER — SODIUM CHLORIDE 0.9 % IV BOLUS (SEPSIS)
500.0000 mL | Freq: Once | INTRAVENOUS | Status: AC
  Administered 2011-11-29: 500 mL via INTRAVENOUS

## 2011-11-29 MED ORDER — SODIUM CHLORIDE 0.9 % IV SOLN
Freq: Once | INTRAVENOUS | Status: AC
  Administered 2011-11-29: 15:00:00 via INTRAVENOUS

## 2011-11-29 MED ORDER — HEPARIN (PORCINE) IN NACL 100-0.45 UNIT/ML-% IJ SOLN
950.0000 [IU]/h | INTRAMUSCULAR | Status: DC
  Administered 2011-11-30: 900 [IU]/h via INTRAVENOUS
  Filled 2011-11-29 (×2): qty 250

## 2011-11-29 MED ORDER — DIPHENHYDRAMINE HCL 50 MG/ML IJ SOLN
50.0000 mg | Freq: Once | INTRAMUSCULAR | Status: AC
  Administered 2011-11-29: 50 mg via INTRAVENOUS
  Filled 2011-11-29: qty 1

## 2011-11-29 MED ORDER — DILTIAZEM HCL 100 MG IV SOLR
5.0000 mg/h | INTRAVENOUS | Status: DC
  Administered 2011-11-29 – 2011-11-30 (×2): 5 mg/h via INTRAVENOUS
  Filled 2011-11-29 (×2): qty 100

## 2011-11-29 NOTE — Patient Instructions (Signed)
  Go to [facility] ED now for Afib with rapid rate

## 2011-11-29 NOTE — Progress Notes (Signed)
ANTICOAGULATION CONSULT NOTE - Initial Consult  Pharmacy Consult for Heparin Indication: atrial fibrillation  Allergies  Allergen Reactions  . Iodine Swelling and Rash    Iodine contrast    Patient Measurements:    Vital Signs: Temp: 97.8 F (36.6 C) (05/22 1312) Temp src: Oral (05/22 1312) BP: 139/99 mmHg (05/22 2030) Pulse Rate: 105  (05/22 2030)  Labs:  Basename 11/29/11 1350 11/29/11 1310 11/29/11 1105  HGB -- 11.8* 11.0*  HCT -- 34.9* 33.3*  PLT -- 149* 124*  APTT 30 -- --  LABPROT 13.0 -- --  INR 0.96 -- --  HEPARINUNFRC -- -- --  CREATININE -- 0.83 --  CKTOTAL -- -- --  CKMB -- -- --  TROPONINI -- -- --    The CrCl is unknown because both a height and weight (above a minimum accepted value) are required for this calculation.   Medical History: Past Medical History  Diagnosis Date  . Breast cancer   . Hypertension   . Hyperlipidemia   . Thyroid disease   . Osteoporosis   . Post-menopausal bleeding   . 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 Low sodium     hx of x 2 per pt and son    Medications:  Infusions:    . diltiazem (CARDIZEM) infusion 5 mg/hr (11/29/11 1419)  . heparin      Assessment:  78 yo female with new onset Atrial Fibrillation w RVR; on Diltiazem infusion  No hx Afib, no anti-coagulation   H/H low, Platelets low but increasing s/p chemo  Baseline INR normal  Pt is elderly, will aim for lower end of therapeutic range.  Goal of Therapy:  Heparin level 0.3-0.7 units/ml Monitor platelets by anticoagulation protocol: Yes   Plan:  Heparin bolus 1500 units Heparin infusion at 900 units hr Heparin level in 8 hr, and daily  Daily CBC while on Heparin  Otho Bellows PharmD 11/29/2011,10:20 PM

## 2011-11-29 NOTE — Progress Notes (Signed)
OFFICE PROGRESS NOTE Date of Visit 11-29-2011  Physicians: Kaylee Pugh [facility] gyn onc), Kaylee Pugh (PA Kaylee Pugh), [place] (cardiology [place]), Laurian Brim [facility])  INTERVAL HISTORY:  Patient is seen, together with granddaughter (who is PA in family practice), in follow up of her first cycle of taxotere/carboplatin given 11-22-2011 for IIIC uterine carcinosarcoma. History is of vaginal spotting beginning Dec 2012, with gyn evaluation delayed because of orthopedic back problems then. She saw gynecologist in Feb 2013, with endometrial biopsy revealing endometrial cancer, tho specimen was scant. She was seen in consultation by Kaylee Pugh 09-27-2011 and went to surgery by Kaylee.Soper at [facility] 10-06-2011, which was robotic hysterectomy/BSO/ bilateral pelvic lymphadenectomy (periaortic nodes not visualized). [facility] [phone]) from 10-06-11 showed carcinosarcoma, heterologous type with rhabdomyosarcomatous differentiation, FIGO grade 3, near transmural invasion in anterior and posterior lower uterine segment (less than 1 mm from serosal surface). 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 She saw Kaylee Pugh in follow up 10-23-2011 and is scheduled back to him July 22. With high risk for recurrence in upper abdomen and for distant recurrence, Kaylee Pugh has recommended taxane + carboplatin given in sandwich fashion with RT. She has not had imaging in [facility] system and has not yet been referred to RT. Taxotere was chosen in preference to taxol due to preexisting peripheral neuropathy. She did have neulasta an 11-23-11 as is standard with taxotere. Patient has had several problems since treatment, but did not contact our office. She has been short of breath with minimal exertion such as walking < 1 block, and today short of breath while dressing  such that she had to lie down. She has had no cough or wheezing and no chest pain.She had left shoulder pain on 5-19 which resolved without intervention, duration not clear. She has had some nausea without vomiting and coated, sore mouth despite biotene. She had no bowel movement x5 days after chemo, then "diarrhea" for past 2 days, but has continued to take Senokot S (not listed on her meds) bid. She has also continued daily claritin for neulasta aches. We have discussed laxatives and I have told her to stop claritin now. She did not seem to have significant neulasta aches. She has had no fever but has had hot flashes. She is having difficulty eating with sore mouth and taste changes. Review of Systems otherwise: bladder ok, no bleeding, no problems at IV site. Had some ear pain in past few days.  Pathology and operative notes from [facility] received earlier this week. No axillary lymph nodes were present in surgical specimen at mastectomy. As she also did not have RT, we will be able to use LUE for IV access. Objective:  Vital signs in last 24 hours:  BP 119/77  Pulse 74  Temp(Src) 96.7 F (35.9 C) (Oral)  Ht 5' 3.62" (1.616 m)  Wt 145 lb 3.2 oz (65.862 kg)  BMI 25.22 kg/m2 Weight is down ~ 0.5 lb. Ambulatory with cane, but looks generally weak. Respirations not labored RA. Granddaughter very helpful.  HEENT:mucous membranes moist, pharynx normal without lesions. Tongue has whitish coating, and some thrush left anterior buccal mucosa. PERRL, not icteric. LymphaticsCervical, supraclavicular, and axillary nodes normal.No inguinal adenopathy Resp: normal percussion bilaterally and clear to auscultation except decreased breath sounds left lower lung posteriorly. No wheezes or rales. No use of accessory muscles. Cardio:  irregularly irregular rhythm, rate ~130. GI: soft, non-tender; bowel sounds normal; no masses,  no organomegaly.Surgical incisions healed.  Extremities: extremities  normal, atraumatic, no cyanosis or edema Neuro:decreased light touch hands/feet.Speech fluent and appropriate. Left mastectomy.  Lab Results:   Basename 11/29/11 1310 11/29/11 1105  WBC 23.3* 18.6*  HGB 11.8* 11.0*  HCT 34.9* 33.3*  PLT 149* 124*  ANC 14.2 related to neulasta 11-23-11, as is the total WBC  BMET  Basename 11/29/11 1310  NA 123*  K 4.1  CL 88*  CO2 25  GLUCOSE 98  BUN 10  CREATININE 0.83  CALCIUM 9.1  chemistries resulted from ED labs after visit  Studies/Results:  EKG at [facility] now shows atrial fibrillation with ventricular rate 117. Machine reads RBBB and LAFB.  CXR and CT angio chest resulted from ED evaluation after office visit Dg Chest 2 View  11/29/2011  *RADIOLOGY REPORT*  Clinical Data: Shortness of breath with weakness.  Atrial fibrillation.  History of uterine cancer.  CHEST - 2 VIEW  Comparison: None.  Findings: The heart size is normal.  There is mild aortic atherosclerosis without focal dilatation.  There is mild atelectasis at the left lung base.  No edema, confluent airspace opacity or significant pleural effusion is seen.  The bones are diffusely demineralized without apparent focal fracture.  IMPRESSION: Mild left basilar atelectasis.  No consolidation or edema.  Original Report Authenticated By: Gerrianne Scale, M.D.   Ct Angio Chest W/cm &/or Wo Cm  11/29/2011  *RADIOLOGY REPORT*  Clinical Data: Atrial fibrillation, cancer, evaluate for PE  CT ANGIOGRAPHY CHEST  Technique:  Multidetector CT imaging of the chest using the standard protocol during bolus administration of intravenous contrast. Multiplanar reconstructed images including MIPs were obtained and reviewed to evaluate the vascular anatomy.  Contrast: 80mL OMNIPAQUE IOHEXOL 300 MG/ML  SOLN  Comparison: Chest radiographs dated 11/29/2011  Findings: No evidence of pulmonary embolism.  Mild subpleural nodularity in the anterior right upper lobe, possibly pleural parenchymal scarring (series  12/image 11). Dependent atelectasis in the bilateral lower lobes.  No pleural effusion or pneumothorax.  Mild cardiomegaly.  No pericardial effusion.  Coronary atherosclerosis.  Mild atherosclerotic calcifications of the aortic arch.  Enlargement of the bilateral pulmonary arteries, suggesting pulmonary arterial hypertension.  No suspicious mediastinal, hilar, or axillary lymphadenopathy.  Status post left mastectomy.  Visualized upper abdomen is unremarkable.  Degenerative changes of the visualized thoracolumbar spine.  IMPRESSION: No evidence of pulmonary embolism.  Mild cardiomegaly.  Suspected pulmonary arterial hypertension.  Original Report Authenticated By: Charline Bills, M.D.    Medications: I have reviewed the patient's current medications. Patient is not clear about all medications and did not bring these to visit today; she reports that she has recently stopped both bystolic and hyzaar.  I have called patient's cardiologist in [facility] [phone]), however that office tells me that he will not be available until later in afternoon. As patient is clearly symptomatic with what appears to be new onset Afib, she will go to ED at [facility] for evaluation and treatment. Patient and granddaughter understand and agree; I have notified ED staff of situation. I do not believe that the Afib is direct result of the recent chemotherapy.  Assessment/Plan: 1.IIIC carcinosarcoma of uterus: history as above, day 8 cycle 1 adjuvant taxotere/carboplatin. Counts are not yet at nadir, tho hopefully neulasta will lessen WBC nadir. She is scheduled back to me with repeat labs on 12-06-11; cycle 2 chemo would be due 12-13-11. 2.new atrial  fibrillation with RVR: patient taken to ED now 3.hyponatremia: history of hyponatremia previously, particularly with surgeries.  4.oral candida: would be helpful to begin diflucan 5.some confusion re medications 6.history of T1Nx left breast cancer 2002 treated  with mastectomy only and no known active disease 7.osteoporosis 8.hypothyroidism on replacement 9.L4-5 disc disease 10. Peripheral neuropathy preceding chemotherapy  Kaylee Pugh P, MD   11/29/2011, 10:11 PM

## 2011-11-29 NOTE — H&P (Addendum)
Kaylee Pugh is an 78 y.o. female.   PCP - in [facility]. Oncologist - Dr.Livesay. Chief Complaint: Shortness of breath and palpitations. HPI: 78 year-old female with recent diagnosis of uterine cancer status post hysterectomy at [facility] - Kaylee Pugh and is receiving chemotherapy in [place], the first cycle was last week had gone to followup with Dr[facility] Darrold [facility]an [facility] when she was found to be tachycardic. Patient was referred to the ER and was found to have atrial fibrillation with RVR. Patient has been started on Cardizem drip after bolus and patient will be admitted for further management. Patient states over the last 2-3 days she has been having shortness of breath both exertional and at rest with occasional palpitations. Patient did have a similar symptom last July and had undergone cardiac catheter at [facility] and as per patient's son it was normal. Since then she did not have further episodes of shortness of breath or palpitations. Presently she's not in acute distress. Denies any chest pain, fever chills cough or productive sputum. She did have some diarrhea around 2-3 times daily since Monday 3 days ago. Denies any abdominal pain nausea or vomiting. Patient after the chemotherapy did get Neulasta as per patient's son.  Past Medical History  Diagnosis Date  . Breast cancer   . Hypertension   . Hyperlipidemia   . Thyroid disease   . Osteoporosis   . Post-menopausal bleeding   . 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 Low sodium     hx of x 2 per pt and son    Past Surgical History  Procedure Date  . Cataract extraction 1987    Left eye  . Cataract extraction 2002    Right eye  . Retinal detachment surgery 2000  . Bladder repair 2001  . Tubal ligation 1966  . Appendectomy 1966  . Mastectomy 2002    left  . Abdominal hysterectomy     Family History  Problem Relation Age of Onset  . Diabetes Mother   .  Heart disease Father   . Kidney disease Sister    Social History:  reports that she has never smoked. She has never used smokeless tobacco. She reports that she does not drink alcohol or use illicit drugs.  Allergies:  Allergies  Allergen Reactions  . Iodine Swelling and Rash    Iodine contrast     (Not in a hospital admission)  Results for orders placed during the hospital encounter of 11/29/11 (from the past 48 hour(s))  CBC     Status: Abnormal   Collection Time   11/29/11  1:10 PM      Component Value Range Comment   WBC 23.3 (*) 4.0 - 10.5 (K/uL) WHITE COUNT CONFIRMED ON SMEAR   RBC 4.20  3.87 - 5.11 (MIL/uL)    Hemoglobin 11.8 (*) 12.0 - 15.0 (g/dL)    HCT 95.6 (*) 21.3 - 46.0 (%)    MCV 83.1  78.0 - 100.0 (fL)    MCH 28.1  26.0 - 34.0 (pg)    MCHC 33.8  30.0 - 36.0 (g/dL)    RDW 08.6  57.8 - 46.9 (%)    Platelets 149 (*) 150 - 400 (K/uL)   DIFFERENTIAL     Status: Abnormal   Collection Time   11/29/11  1:10 PM      Component Value Range Comment   Neutrophils Relative 61  43 - 77 (%)  Lymphocytes Relative 25  12 - 46 (%)    Monocytes Relative 14 (*) 3 - 12 (%)    Eosinophils Relative 0  0 - 5 (%)    Basophils Relative 0  0 - 1 (%)    Band Neutrophils 0  0 - 10 (%)    Metamyelocytes Relative 0      Myelocytes 0      Promyelocytes Absolute 0      Blasts 0      nRBC 0  0 (/100 WBC)    Neutro Abs 14.2 (*) 1.7 - 7.7 (K/uL)    Lymphs Abs 5.8 (*) 0.7 - 4.0 (K/uL)    Monocytes Absolute 3.3 (*) 0.1 - 1.0 (K/uL)    Eosinophils Absolute 0.0  0.0 - 0.7 (K/uL)    Basophils Absolute 0.0  0.0 - 0.1 (K/uL)    WBC Morphology        Value: MODERATE LEFT SHIFT (>5% METAS AND MYELOS,OCC PRO NOTED)  COMPREHENSIVE METABOLIC PANEL     Status: Abnormal   Collection Time   11/29/11  1:10 PM      Component Value Range Comment   Sodium 123 (*) 135 - 145 (mEq/L)    Potassium 4.1  3.5 - 5.1 (mEq/L)    Chloride 88 (*) 96 - 112 (mEq/L)    CO2 25  19 - 32 (mEq/L)    Glucose, Bld 98   70 - 99 (mg/dL)    BUN 10  6 - 23 (mg/dL)    Creatinine, Ser 8.29  0.50 - 1.10 (mg/dL)    Calcium 9.1  8.4 - 10.5 (mg/dL)    Total Protein 6.4  6.0 - 8.3 (g/dL)    Albumin 3.5  3.5 - 5.2 (g/dL)    AST 24  0 - 37 (U/L)    ALT 16  0 - 35 (U/L)    Alkaline Phosphatase 100  39 - 117 (U/L)    Total Bilirubin 0.3  0.3 - 1.2 (mg/dL)    GFR calc non Af Amer 63 (*) >90 (mL/min)    GFR calc Af Amer 72 (*) >90 (mL/min)   POCT I-STAT TROPONIN I     Status: Normal   Collection Time   11/29/11  1:21 PM      Component Value Range Comment   Troponin i, poc 0.01  0.00 - 0.08 (ng/mL)    Comment 3            APTT     Status: Normal   Collection Time   11/29/11  1:50 PM      Component Value Range Comment   aPTT 30  24 - 37 (seconds)   PROTIME-INR     Status: Normal   Collection Time   11/29/11  1:50 PM      Component Value Range Comment   Prothrombin Time 13.0  11.6 - 15.2 (seconds)    INR 0.96  0.00 - 1.49     Dg Chest 2 View  11/29/2011  *RADIOLOGY REPORT*  Clinical Data: Shortness of breath with weakness.  Atrial fibrillation.  History of uterine cancer.  CHEST - 2 VIEW  Comparison: None.  Findings: The heart size is normal.  There is mild aortic atherosclerosis without focal dilatation.  There is mild atelectasis at the left lung base.  No edema, confluent airspace opacity or significant pleural effusion is seen.  The bones are diffusely demineralized without apparent focal fracture.  IMPRESSION: Mild left basilar atelectasis.  No consolidation or  edema.  Original Report Authenticated By: Gerrianne Scale, M.D.   Ct Angio Chest W/cm &/or Wo Cm  11/29/2011  *RADIOLOGY REPORT*  Clinical Data: Atrial fibrillation, cancer, evaluate for PE  CT ANGIOGRAPHY CHEST  Technique:  Multidetector CT imaging of the chest using the standard protocol during bolus administration of intravenous contrast. Multiplanar reconstructed images including MIPs were obtained and reviewed to evaluate the vascular anatomy.   Contrast: 80mL OMNIPAQUE IOHEXOL 300 MG/ML  SOLN  Comparison: Chest radiographs dated 11/29/2011  Findings: No evidence of pulmonary embolism.  Mild subpleural nodularity in the anterior right upper lobe, possibly pleural parenchymal scarring (series 12/image 11). Dependent atelectasis in the bilateral lower lobes.  No pleural effusion or pneumothorax.  Mild cardiomegaly.  No pericardial effusion.  Coronary atherosclerosis.  Mild atherosclerotic calcifications of the aortic arch.  Enlargement of the bilateral pulmonary arteries, suggesting pulmonary arterial hypertension.  No suspicious mediastinal, hilar, or axillary lymphadenopathy.  Status post left mastectomy.  Visualized upper abdomen is unremarkable.  Degenerative changes of the visualized thoracolumbar spine.  IMPRESSION: No evidence of pulmonary embolism.  Mild cardiomegaly.  Suspected pulmonary arterial hypertension.  Original Report Authenticated By: Charline Bills, M.D.    Review of Systems  Constitutional: Negative.   HENT: Negative.   Eyes: Negative.   Respiratory: Positive for shortness of breath.   Cardiovascular: Positive for palpitations.  Gastrointestinal: Positive for diarrhea.  Genitourinary: Negative.   Musculoskeletal: Negative.   Skin: Negative.   Neurological: Negative.   Endo/Heme/Allergies: Negative.   Psychiatric/Behavioral: Negative.     Blood pressure 139/99, pulse 105, temperature 97.8 F (36.6 C), temperature source Oral, resp. rate 22, SpO2 98.00%. Physical Exam  Constitutional: She is oriented to person, place, and time. She appears well-developed and well-nourished. No distress.  HENT:  Head: Normocephalic and atraumatic.  Right Ear: External ear normal.  Left Ear: External ear normal.  Nose: Nose normal.  Mouth/Throat: Oropharynx is clear and moist. No oropharyngeal exudate.  Eyes: Conjunctivae are normal. Pupils are equal, round, and reactive to light. Right eye exhibits no discharge. Left eye  exhibits no discharge. No scleral icterus.  Neck: Normal range of motion. Neck supple.  Cardiovascular:       Tachycardia with irregular rhythm.  Respiratory: Effort normal and breath sounds normal. No respiratory distress. She has no wheezes. She has no rales.  GI: Soft. Bowel sounds are normal. She exhibits no distension. There is no tenderness. There is no rebound.  Musculoskeletal: Normal range of motion. She exhibits no edema and no tenderness.  Neurological: She is alert and oriented to person, place, and time.       Moves all extremities.  Skin: Skin is warm and dry. No rash noted. She is not diaphoretic. No erythema.  Psychiatric: Her behavior is normal.     Assessment/Plan #1. Atrial fibrillation with RVR and shortness of breath - for now we'll continue Cardizem infusion to control heart rate. I have added Cardizem by mouth 60 mg every 6 hours. Slowly once by mouth Cardizem is being taken we will try to taper off Cardizem infusion. Do not know what precipitated the atrial fibrillation with RVR. Patient's shortness of breath could be related to the atrial fibrillation with RVR. CT angiogram chest was negative for PE but does so features of cardiomegaly and pulmonary hypertension. Get 2-D echo. Patient's CHADS2 score is only one at this time but if proven to have CHF then patient will qualify for long-term anticoagulation. Until then I will keep her on IV  heparin, I did discuss with Dr. Clelia Croft. Check BNP. We will also check TSH, cardiac enzymes. Follow intake output. #2. Hyponatremia with history of SIADH - Dr. Precious Reel notes clearly mentions patient has SIADH. Her hyponatremia is probably from SIADH. At this time we will restrict fluids to 1 L per day. Check urine sodium and osmolality. Closely follow metabolic panel. #3. Significant leukocytosis - patient did get Neulasta per patient's son last week after chemotherapy which could be causing the leukocytosis. At this time patient does not  look septic and is afebrile. CT chest did not show any infective process and we will check a urinalysis. Closely follow CBC. #4. Mild anemia and thrombocytopenia - closely follow CBC. #5. Hypothyroidism - check TSH. Continue Synthroid. #6. History of hyperlipidemia - continue statins. #7. History of uterine cancer and breast cancer - per oncology. #8. Oral thrush being treated with fluconazole.  CODE STATUS - full code.  Eduard Clos 11/29/2011, 9:27 PM

## 2011-11-29 NOTE — ED Notes (Signed)
Pt was sent from [facility] by Dr. Darrold Span.  Had her first chemo last week.  Reports Rapid afib.  Pt reports SOB, but denies any dizziness or cp at this time.

## 2011-11-29 NOTE — ED Provider Notes (Signed)
The cardiac monitor continues to show irregularly, irregular rhythm, rate, now 85-100 beats per min., With blood pressure ranging from 107/72 to 122/77. Patient remains alert and calm. She is comfortable. She remains on Cardizem drip 5 mg per minute.\  Evaluation is consistent with new onset Atrial fibrillation, with RVR, now rate controlled. She will need to be admitted for rate control, anticoagulation, and echocardiography. Her PCP is in [place]. Will admit to the hospitalist service.    Flint Melter, MD 11/30/11 [phone]

## 2011-11-29 NOTE — ED Provider Notes (Addendum)
History     CSN: [id number]  Arrival date & time 11/29/11  1250   First MD Initiated Contact with Patient 11/29/11 1323      Chief Complaint  Patient presents with  . Atrial Fibrillation  . Shortness of Breath    (Consider location/radiation/quality/duration/timing/severity/associated sxs/prior treatment) HPI Comments: Patient presents from the cancer Center where she was sent by Dr. Darrold Span for new onset atrial fibrillation with RVR.  Patient has no prior history of atrial fibrillation or cardiac disease.  Patient received her first chemotherapy treatment last week and this was a followup appointment.  Patient denies any chest pain but does note she's had intermittent shortness of breath over the last week and it was more persistent today.  No leg swelling.  No prior blood clots.  Patient's chemotherapy his treatment for ongoing uterine cancer.  The history is provided by the patient and a relative. No language interpreter was used.    Past Medical History  Diagnosis Date  . Breast cancer   . Hypertension   . Hyperlipidemia   . Thyroid disease   . Osteoporosis   . Post-menopausal bleeding     Past Surgical History  Procedure Date  . Mastectomy 2002  . Cataract extraction 1987    Left eye  . Cataract extraction 2002    Right eye  . Retinal detachment surgery 2000  . Bladder repair 2001  . Tubal ligation 1966  . Appendectomy 1966    Family History  Problem Relation Age of Onset  . Diabetes Mother   . Heart disease Father   . Kidney disease Sister     History  Substance Use Topics  . Smoking status: Never Smoker   . Smokeless tobacco: Not on file  . Alcohol Use: No    OB History    Grav Para Term Preterm Abortions TAB SAB Ect Mult Living                  Review of Systems  Constitutional: Negative.  Negative for fever and chills.  HENT: Negative.   Eyes: Negative.  Negative for discharge and redness.  Respiratory: Positive for shortness of breath.  Negative for cough.   Cardiovascular: Positive for palpitations. Negative for chest pain.  Gastrointestinal: Negative.  Negative for nausea, vomiting, abdominal pain and diarrhea.  Genitourinary: Negative.  Negative for dysuria and vaginal discharge.  Musculoskeletal: Negative.  Negative for back pain.  Skin: Negative.  Negative for color change and rash.  Neurological: Negative.  Negative for syncope and headaches.  Hematological: Negative.  Negative for adenopathy.  Psychiatric/Behavioral: Negative.  Negative for confusion.  All other systems reviewed and are negative.    Allergies  Iodine  Home Medications   Current Outpatient Rx  Name Route Sig Dispense Refill  . ACETAMINOPHEN 500 MG PO TABS Oral Take 500 mg by mouth every 6 (six) hours as needed. Pain    . ASPIRIN 81 MG PO TABS Oral Take 81 mg by mouth daily.    Marland Kitchen CALCIUM CARBONATE-VITAMIN D 600-200 MG-UNIT PO TABS Oral Take 2 tablets by mouth at bedtime.     Marland Kitchen DEXAMETHASONE 4 MG PO TABS Oral Take 4 mg by mouth See admin instructions. Pt takes 2 tabs the day before chemo,2 tabs the day of chemo,2 tabs the day after.    Marland Kitchen ESOMEPRAZOLE MAGNESIUM 40 MG PO CPDR Oral Take 40 mg by mouth daily before breakfast.    . IBUPROFEN 200 MG PO TABS Oral Take 200 mg by  mouth every 6 (six) hours as needed. Pain    . LEVOTHYROXINE SODIUM 50 MCG PO TABS Oral Take 50 mcg by mouth daily.    Marland Kitchen LORATADINE 10 MG PO CAPS Oral Take 10 mg by mouth daily.    Marland Kitchen ONDANSETRON HCL 4 MG PO TABS Oral Take 4 mg by mouth every 8 (eight) hours as needed. nausea    . PRESCRIPTION MEDICATION  Pt [facility]. Pt's last treatment was on 11-22-11 pt's on every 3 week cycle. Next treatment is  due June 5. Followed by Dr Darrold Span    . ROSUVASTATIN CALCIUM 5 MG PO TABS Oral Take 5 mg by mouth daily.      BP 124/79  Pulse 115  Temp(Src) 97.8 F (36.6 C) (Oral)  Resp 24  SpO2 99%  Physical Exam  Nursing note and vitals reviewed. Constitutional: She is oriented  to person, place, and time. She appears well-developed and well-nourished.  Non-toxic appearance. She does not have a sickly appearance.  HENT:  Head: Normocephalic and atraumatic.  Eyes: Conjunctivae, EOM and lids are normal. Pupils are equal, round, and reactive to light. No scleral icterus.  Neck: Trachea normal and normal range of motion. Neck supple.  Cardiovascular: Normal heart sounds.  An irregularly irregular rhythm present. Tachycardia present.   Pulmonary/Chest: Effort normal and breath sounds normal. No respiratory distress. She has no wheezes. She has no rales.  Abdominal: Soft. Normal appearance. There is no tenderness. There is no rebound, no guarding and no CVA tenderness.  Musculoskeletal: Normal range of motion. She exhibits no edema and no tenderness.  Neurological: She is alert and oriented to person, place, and time. She has normal strength.  Skin: Skin is warm, dry and intact. No rash noted.  Psychiatric: She has a normal mood and affect. Her behavior is normal. Judgment and thought content normal.    ED Course  Procedures (including critical care time)  Results for orders placed during the hospital encounter of 11/29/11  COMPREHENSIVE METABOLIC PANEL      Component Value Range   Sodium 123 (*) 135 - 145 (mEq/L)   Potassium 4.1  3.5 - 5.1 (mEq/L)   Chloride 88 (*) 96 - 112 (mEq/L)   CO2 25  19 - 32 (mEq/L)   Glucose, Bld 98  70 - 99 (mg/dL)   BUN 10  6 - 23 (mg/dL)   Creatinine, Ser 1.61  0.50 - 1.10 (mg/dL)   Calcium 9.1  8.4 - 09.6 (mg/dL)   Total Protein 6.4  6.0 - 8.3 (g/dL)   Albumin 3.5  3.5 - 5.2 (g/dL)   AST 24  0 - 37 (U/L)   ALT 16  0 - 35 (U/L)   Alkaline Phosphatase 100  39 - 117 (U/L)   Total Bilirubin 0.3  0.3 - 1.2 (mg/dL)   GFR calc non Af Amer 63 (*) >90 (mL/min)   GFR calc Af Amer 72 (*) >90 (mL/min)  APTT      Component Value Range   aPTT 30  24 - 37 (seconds)  PROTIME-INR      Component Value Range   Prothrombin Time 13.0  11.6 - 15.2  (seconds)   INR 0.96  0.00 - 1.49   POCT I-STAT TROPONIN I      Component Value Range   Troponin i, poc 0.01  0.00 - 0.08 (ng/mL)   Comment 3            Dg Chest 2 View  11/29/2011  *  RADIOLOGY REPORT*  Clinical Data: Shortness of breath with weakness.  Atrial fibrillation.  History of uterine cancer.  CHEST - 2 VIEW  Comparison: None.  Findings: The heart size is normal.  There is mild aortic atherosclerosis without focal dilatation.  There is mild atelectasis at the left lung base.  No edema, confluent airspace opacity or significant pleural effusion is seen.  The bones are diffusely demineralized without apparent focal fracture.  IMPRESSION: Mild left basilar atelectasis.  No consolidation or edema.  Original Report Authenticated By: Gerrianne Scale, M.D.       Date: 11/29/2011  Rate: 119  Rhythm: atrial fibrillation  QRS Axis: left  Intervals: normal  ST/T Wave abnormalities: nonspecific T wave changes  Conduction Disutrbances:right bundle branch block and left anterior fascicular block  Narrative Interpretation:   Old EKG Reviewed: None available   MDM   patient with new onset atrial fibrillation with RVR.  Patient will be placed on a diltiazem bolus and drip to help control her heart rate.  Given that the patient has ongoing cancer which makes her hypercoagulable I considered the patient may have pulmonary embolus as the cause for her atrial fibrillation.  Patient does have a iodine allergy but his only had a reaction of rash and itching.  The granddaughter reports that last 2 the patient was able to receive Benadryl and steroids without any problems with receiving contrast.  We will proceed with this plan to obtain a CT angiogram today.  Patient will require admission for further control of her A. fib and RVR regardless of whether or not she has a PE today.         Nat Christen, MD 11/29/11 1415  Nat Christen, MD 11/29/11 [phone]

## 2011-11-29 NOTE — ED Notes (Signed)
Pt to CT scan, VS stable, no distress noted

## 2011-11-30 ENCOUNTER — Encounter (HOSPITAL_COMMUNITY): Payer: Self-pay | Admitting: *Deleted

## 2011-11-30 DIAGNOSIS — I4891 Unspecified atrial fibrillation: Principal | ICD-10-CM

## 2011-11-30 DIAGNOSIS — C549 Malignant neoplasm of corpus uteri, unspecified: Secondary | ICD-10-CM

## 2011-11-30 DIAGNOSIS — E871 Hypo-osmolality and hyponatremia: Secondary | ICD-10-CM

## 2011-11-30 DIAGNOSIS — D72829 Elevated white blood cell count, unspecified: Secondary | ICD-10-CM

## 2011-11-30 DIAGNOSIS — E032 Hypothyroidism due to medicaments and other exogenous substances: Secondary | ICD-10-CM

## 2011-11-30 DIAGNOSIS — B370 Candidal stomatitis: Secondary | ICD-10-CM | POA: Diagnosis present

## 2011-11-30 LAB — SODIUM, URINE, RANDOM: Sodium, Ur: 46 mEq/L

## 2011-11-30 LAB — CARDIAC PANEL(CRET KIN+CKTOT+MB+TROPI)
CK, MB: 3.7 ng/mL (ref 0.3–4.0)
CK, MB: 3.2 ng/mL (ref 0.3–4.0)
Relative Index: INVALID (ref 0.0–2.5)
Relative Index: INVALID (ref 0.0–2.5)
Total CK: 68 U/L (ref 7–177)
Total CK: 53 U/L (ref 7–177)
Total CK: 47 U/L (ref 7–177)
Troponin I: <0.3 ng/mL (ref <0.30)

## 2011-11-30 LAB — URINALYSIS, ROUTINE W REFLEX MICROSCOPIC
Ketones, ur: NEGATIVE mg/dL
Protein, ur: NEGATIVE mg/dL
Urobilinogen, UA: 0.2 mg/dL (ref 0.0–1.0)

## 2011-11-30 LAB — DIFFERENTIAL
Band Neutrophils: 7 % (ref 0–10)
Basophils Absolute: 0.4 10*3/uL — ABNORMAL HIGH (ref 0.0–0.1)
Basophils Relative: 1 % (ref 0–1)
Lymphocytes Relative: 5 % — ABNORMAL LOW (ref 12–46)
Lymphs Abs: 2 10*3/uL (ref 0.7–4.0)
Monocytes Absolute: 0.4 10*3/uL (ref 0.1–1.0)
Monocytes Relative: 1 % — ABNORMAL LOW (ref 3–12)
Neutro Abs: 37.8 10*3/uL — ABNORMAL HIGH (ref 1.7–7.7)
Neutrophils Relative %: 83 % — ABNORMAL HIGH (ref 43–77)
Promyelocytes Absolute: 0 %

## 2011-11-30 LAB — CLOSTRIDIUM DIFFICILE BY PCR: Toxigenic C. Difficile by PCR: NEGATIVE

## 2011-11-30 LAB — PRO B NATRIURETIC PEPTIDE: Pro B Natriuretic peptide (BNP): 1916 pg/mL — ABNORMAL HIGH (ref 0–450)

## 2011-11-30 LAB — TSH: TSH: 1.509 u[IU]/mL (ref 0.350–4.500)

## 2011-11-30 LAB — COMPREHENSIVE METABOLIC PANEL
AST: 18 U/L (ref 0–37)
Albumin: 3.1 g/dL — ABNORMAL LOW (ref 3.5–5.2)
BUN: 11 mg/dL (ref 6–23)
Calcium: 9.2 mg/dL (ref 8.4–10.5)
Creatinine, Ser: 0.77 mg/dL (ref 0.50–1.10)
Total Bilirubin: 0.2 mg/dL — ABNORMAL LOW (ref 0.3–1.2)
Total Protein: 5.7 g/dL — ABNORMAL LOW (ref 6.0–8.3)

## 2011-11-30 LAB — CBC
HCT: 30.9 % — ABNORMAL LOW (ref 36.0–46.0)
Hemoglobin: 11 g/dL — ABNORMAL LOW (ref 12.0–15.0)
MCH: 27.4 pg (ref 26.0–34.0)
MCHC: 32.7 g/dL (ref 30.0–36.0)
MCHC: 33.3 g/dL (ref 30.0–36.0)
MCV: 83.6 fL (ref 78.0–100.0)
Platelets: 131 10*3/uL — ABNORMAL LOW (ref 150–400)
RBC: 4.02 MIL/uL (ref 3.87–5.11)
RDW: 13.7 % (ref 11.5–15.5)
WBC: 40.6 10*3/uL — ABNORMAL HIGH (ref 4.0–10.5)

## 2011-11-30 LAB — URINE MICROSCOPIC-ADD ON

## 2011-11-30 LAB — HEPARIN LEVEL (UNFRACTIONATED)
Heparin Unfractionated: 0.3 IU/mL (ref 0.30–0.70)
Heparin Unfractionated: 0.4 IU/mL (ref 0.30–0.70)

## 2011-11-30 LAB — CREATININE, SERUM
Creatinine, Ser: 0.82 mg/dL (ref 0.50–1.10)
GFR calc Af Amer: 74 mL/min — ABNORMAL LOW (ref >90)

## 2011-11-30 MED ORDER — FLUCONAZOLE 100 MG PO TABS
100.0000 mg | ORAL_TABLET | Freq: Every day | ORAL | Status: DC
  Filled 2011-11-30: qty 1

## 2011-11-30 MED ORDER — ASPIRIN EC 325 MG PO TBEC
325.0000 mg | DELAYED_RELEASE_TABLET | Freq: Every day | ORAL | Status: DC
  Administered 2011-11-30: 325 mg via ORAL
  Filled 2011-11-30 (×2): qty 1

## 2011-11-30 MED ORDER — ONDANSETRON HCL 4 MG PO TABS: 4.0000 mg | ORAL_TABLET | Freq: Four times a day (QID) | ORAL | Status: DC | PRN

## 2011-11-30 MED ORDER — LORATADINE 10 MG PO TABS
10.0000 mg | ORAL_TABLET | Freq: Every day | ORAL | Status: DC
  Administered 2011-12-01: 10 mg via ORAL
  Filled 2011-11-30 (×2): qty 1

## 2011-11-30 MED ORDER — NYSTATIN 100000 UNIT/ML MT SUSP
5.0000 mL | Freq: Four times a day (QID) | OROMUCOSAL | Status: DC
  Administered 2011-11-30 (×3): 500000 [IU] via ORAL
  Filled 2011-11-30: qty 60

## 2011-11-30 MED ORDER — ACETAMINOPHEN 650 MG RE SUPP: 650.0000 mg | Freq: Four times a day (QID) | RECTAL | Status: DC | PRN

## 2011-11-30 MED ORDER — ACETAMINOPHEN 325 MG PO TABS: 650.0000 mg | ORAL_TABLET | Freq: Four times a day (QID) | ORAL | Status: DC | PRN

## 2011-11-30 MED ORDER — BOOST PLUS PO LIQD
237.0000 mL | Freq: Two times a day (BID) | ORAL | Status: DC
  Administered 2011-11-30: 237 mL via ORAL
  Filled 2011-11-30 (×3): qty 237

## 2011-11-30 MED ORDER — SODIUM CHLORIDE 0.9 % IJ SOLN: 3.0000 mL | Freq: Two times a day (BID) | INTRAMUSCULAR | Status: DC

## 2011-11-30 MED ORDER — FLUCONAZOLE 100 MG PO TABS
100.0000 mg | ORAL_TABLET | Freq: Every day | ORAL | Status: DC
  Administered 2011-11-30 – 2011-12-01 (×2): 100 mg via ORAL
  Filled 2011-11-30 (×2): qty 1

## 2011-11-30 MED ORDER — CALCIUM CARBONATE-VITAMIN D 500-200 MG-UNIT PO TABS
2.0000 | ORAL_TABLET | Freq: Every day | ORAL | Status: DC
  Administered 2011-11-30: 2 via ORAL
  Filled 2011-11-30 (×2): qty 2

## 2011-11-30 MED ORDER — LEVOTHYROXINE SODIUM 50 MCG PO TABS
50.0000 ug | ORAL_TABLET | Freq: Every day | ORAL | Status: DC
  Administered 2011-11-30 – 2011-12-01 (×2): 50 ug via ORAL
  Filled 2011-11-30 (×2): qty 1

## 2011-11-30 MED ORDER — PANTOPRAZOLE SODIUM 40 MG PO TBEC
40.0000 mg | DELAYED_RELEASE_TABLET | Freq: Every day | ORAL | Status: DC
  Administered 2011-11-30 – 2011-12-01 (×2): 40 mg via ORAL
  Filled 2011-11-30 (×2): qty 1

## 2011-11-30 MED ORDER — ONDANSETRON HCL 4 MG PO TABS: 4.0000 mg | ORAL_TABLET | Freq: Three times a day (TID) | ORAL | Status: DC | PRN

## 2011-11-30 MED ORDER — ONDANSETRON HCL 4 MG/2ML IJ SOLN: 4.0000 mg | Freq: Four times a day (QID) | INTRAMUSCULAR | Status: DC | PRN

## 2011-11-30 MED ORDER — DILTIAZEM HCL 60 MG PO TABS
60.0000 mg | ORAL_TABLET | Freq: Four times a day (QID) | ORAL | Status: DC
  Administered 2011-11-30 – 2011-12-01 (×6): 60 mg via ORAL
  Filled 2011-11-30 (×10): qty 1

## 2011-11-30 MED ORDER — ATORVASTATIN CALCIUM 10 MG PO TABS
10.0000 mg | ORAL_TABLET | Freq: Every day | ORAL | Status: DC
Start: 2011-11-30 — End: 2011-12-01
  Administered 2011-11-30: 10 mg via ORAL
  Filled 2011-11-30 (×2): qty 1

## 2011-11-30 NOTE — Evaluation (Signed)
Physical Therapy Evaluation Patient Details Name: Kaylee Pugh MRN: [id number] DOB: 1925-03-14 Today's Date: 11/30/2011 Time: 1425-1450 PT Time Calculation (min): 25 min  PT Assessment / Plan / Recommendation Clinical Impression  78 yo female admitted with Afib with RVR. HR: 68 bpm at rest, 102 bpm during ambulation, 63 bpm at end of session. Mobilizing well with cane use. Do not anticpate any follow-up PT needs at discharge.    PT Assessment  Patient needs continued PT services    Follow Up Recommendations  No PT follow up    Barriers to Discharge        lEquipment Recommendations  None recommended by PT    Recommendations for Other Services     Frequency Min 3X/week    Precautions / Restrictions Precautions Precautions: None Restrictions Weight Bearing Restrictions: No   Pertinent Vitals/Pain HR: 68 bpm at rest, 102 bpm during ambulation, 63 bpm at end of session      Mobility  Bed Mobility Bed Mobility: Supine to Sit Supine to Sit: 7: Independent Transfers Transfers: Sit to Stand;Stand to Sit Sit to Stand: 5: Supervision Stand to Sit: 5: Supervision Details for Transfer Assistance: VCs safety Ambulation/Gait Ambulation/Gait Assistance: 4: Min guard Ambulation Distance (Feet): 250 Feet Assistive device: Straight cane Ambulation/Gait Assistance Details: Intermittent wobble/stumble. No LOB Gait Pattern: Step-through pattern    Exercises     PT Diagnosis: Difficulty walking  PT Problem List: Decreased mobility PT Treatment Interventions: Gait training;Stair training;Functional mobility training;Therapeutic activities;Patient/family education   PT Goals Acute Rehab PT Goals PT Goal Formulation: With patient Time For Goal Achievement: 12/07/11 Potential to Achieve Goals: Good Pt will Ambulate: >150 feet;with modified independence;with least restrictive assistive device PT Goal: Ambulate - Progress: Goal set today Pt will Go Up / Down Stairs: 1-2 stairs;with  modified independence;with rail(s);with least restrictive assistive device (2 steps) PT Goal: Up/Down Stairs - Progress: Goal set today  Visit Information  Last PT Received On: 11/30/11 Assistance Needed: +1    Subjective Data  Subjective: "It's been 1 thing after the other" Patient Stated Goal: Home   Prior Functioning  Home Living Lives With: Alone Type of Home: House Home Access: Stairs to enter [employer] of Steps: 2 Entrance Stairs-Rails: Right Home Layout: Two level;Able to live on main level with bedroom/bathroom Bathroom Shower/Tub: Tub/shower unit Home Adaptive Equipment: Straight cane;Walker - rolling Prior Function Level of Independence: Independent Able to Take Stairs?: Yes Driving: Yes Communication Communication: No difficulties    Cognition  Overall Cognitive Status: Appears within functional limits for tasks assessed/performed Arousal/Alertness: Awake/alert Orientation Level: Appears intact for tasks assessed Behavior During Session: [employer] for tasks performed    Extremity/Trunk Assessment Right Lower Extremity Assessment RLE ROM/Strength/Tone: [facility] for tasks assessed RLE Coordination: WFL - gross motor Left Lower Extremity Assessment LLE ROM/Strength/Tone: WFL for tasks assessed LLE Coordination: WFL - gross motor Trunk Assessment Trunk Assessment: Normal   Balance Balance Balance Assessed: Yes Static Standing Balance Static Standing - Balance Support: No upper extremity supported;During functional activity Static Standing - Level of Assistance: 5: Stand by assistance Static Standing - Comment/# of Minutes: Stood statically EO/EC, narrow BOS, and while withstanding perturbations without difficulty or LOB. Pt also stood for 1-2 minutes while talking on phone with son.  Dynamic Standing Balance Dynamic Standing - Comments: had pt perform 360 degree turn and p/u object without difficulty-stand by assist only.   End of Session PT - End of  Session Equipment Utilized During Treatment: Gait belt Activity Tolerance: Patient tolerated treatment  well Patient left: in chair;with call bell/phone within reach   Rebeca Alert [facility] 11/30/2011, 3:01 PM [phone]

## 2011-11-30 NOTE — Progress Notes (Signed)
ANTICOAGULATION CONSULT NOTE - Follow Up Consult  Pharmacy Consult for heparin Indication: atrial fibrillation  Allergies  Allergen Reactions  . Iodine Swelling and Rash    Iodine contrast    Patient Measurements: Height: 5\' 3"  (160 cm) Weight: 145 lb 4.5 oz (65.9 kg) IBW/kg (Calculated) : 52.4   Vital Signs: Temp: 97.6 F (36.4 C) (05/23 0615) Temp src: Oral (05/23 0615) BP: 128/62 mmHg (05/23 1232) Pulse Rate: 68  (05/23 1232)  Labs:  Basename 11/30/11 1305 11/30/11 0745 11/30/11 0533 11/30/11 0026 11/29/11 1350 11/29/11 1310  HGB -- -- 10.3* 11.0* -- --  HCT -- -- 30.9* 33.6* -- 34.9*  PLT -- -- 152 131* -- 149*  APTT -- -- -- -- 30 --  LABPROT -- -- -- -- 13.0 --  INR -- -- -- -- 0.96 --  HEPARINUNFRC 0.40 -- 0.30 -- -- --  CREATININE -- -- 0.77 0.82 -- 0.83  CKTOTAL -- 53 -- 68 -- --  CKMB -- 3.2 -- 3.7 -- --  TROPONINI -- <0.30 -- <0.30 -- --    Estimated Creatinine Clearance: 46.9 ml/min (by C-G formula based on Cr of 0.77).   Medications:  Infusions:     . heparin 950 Units/hr (11/30/11 0618)  . DISCONTD: diltiazem (CARDIZEM) infusion Stopped (11/30/11 0600)    Assessment:  78 yo female with uterine cancer, presents with new onset Atrial Fibrillation w RVR, started on diltiazem drip 5/22, changed to po 5/23.  H/H low, Platelets low but increasing s/p chemo  Heparin level therapeutic on 950 units/hr  No bleeding/complications reported  Plans noted to potentially start warfarin   Goal of Therapy:  Heparin level 0.3-0.7 units/ml Monitor platelets by anticoagulation protocol: Yes   Plan:   Continue heparin to 950 units/hr  F/U am heparin level, CBC  Consider changing to Lovenox monotherapy (without warfarin) due to active cancer.   Loralee Pacas, PharmD, BCPS Pager: [phone] 11/30/2011,1:44 PM

## 2011-11-30 NOTE — Progress Notes (Signed)
Medical Oncology  Hospital day 2 Day 9 Cycle 1 taxotere/ carboplatin, with neulasta given Day 2 Cycle 1  Appreciate care for Afib with RVR from ED and hospitalist since yesterday. Patient seen now with son here, feeling much better than PTA. She is less SOB, no chest pain, did sleep some last night. Mouth is still uncomfortable with thrush (likely related to 3 days of decadron used around taxotere) and I have ordered diflucan (ok with other meds per pharmacist now). She had soft stool this AM, improved over loose stools past few days which may have been laxative induced (she continued senokotS even when stools got loose).   Labs noted: elevated WBC is typical for neulasta, Hgb 10.3 and plt 152. Agree with work up in process for hyponatremia, which had been a problem after gyn onc surgery at [facility] (Na then 118 per son). Na was normal at 136 by my labs 11-10-11.   Exam: awake, alert, looks much more comfortable than in office yesterday. Oral candida still apparent. Respirations not labored RA. HR irreg 74. Abdomen soft and nontender. LE no edema. Peripheral IV ok.  Note OK to use LUE for blood draws/ IVs as she had NO axillary nodes removed with mastectomy in 2002 and no RT on that side.  Assessment:  1.IIIC uterine carcinosarcoma: post surgery at [facility] 10-06-11 and first cycle of taxotere/carboplatin on 11-22-11 with neulasta.  2.new onset Afib with RVR, symptomatic with weakness and SOB. Evaluation and rx per admitting service 3.hyponatremia as above 4.history of T1 Nx left breast cancer 2002 treated with mastectomy without axillary node evaluation and no additional therapy, not known recurrent. 5.OK to use LUE for IVs/ blood draws with history above. 6.Oral candida: suggest diflucan as this is bothersome enough to interfere with po intake Please call if I can be of help while she is here. Thank you.  Jama Flavors, MD [phone]

## 2011-11-30 NOTE — Progress Notes (Signed)
INITIAL ADULT NUTRITION ASSESSMENT Date: 11/30/2011   Time: 3:02 PM Reason for Assessment: Nutrition risk   ASSESSMENT: Female 78 y.o.  Dx: Atrial fibrillation with RVR  Food/Nutrition Related Hx: Pt with uterine CA s/p first cycle of chemotherapy last week. Pt reports she has been trying to eat 6 small meals/day at home, but recently has been only able to eat 2 meals/day r/t thrush. Pt states she drinks Boost on/off at home. Pt denies any problems chewing or swallowing. Pt c/o lack of taste, denies any metallic taste. Pt denies any nausea/vomiting, but does say she has had diarrhea for the past 3-4 days and had 1 episode of diarrhea this morning. Pt reports stable weight. Pt states she is eating better today and reports eating 75% of her lunch.   Hx:  Past Medical History  Diagnosis Date  . Breast cancer   . Hypertension   . Hyperlipidemia   . Thyroid disease   . Osteoporosis   . Post-menopausal bleeding   . 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 Low sodium     hx of x 2 per pt and son   Related Meds:  Scheduled Meds:   . aspirin EC  325 mg Oral Daily  . atorvastatin  10 mg Oral q1800  . calcium-vitamin D  2 tablet Oral QHS  . diltiazem  60 mg Oral Q6H  . fluconazole  100 mg Oral Daily  . heparin  1,500 Units Intravenous Once  . levothyroxine  50 mcg Oral Daily  . loratadine  10 mg Oral Daily  . nystatin  5 mL Oral QID  . pantoprazole  40 mg Oral Daily  . sodium chloride  500 mL Intravenous Once  . sodium chloride  3 mL Intravenous Q12H  . sodium chloride  3 mL Intravenous Q12H  . DISCONTD: fluconazole  100 mg Oral Daily   Continuous Infusions:   . heparin 950 Units/hr (11/30/11 0618)  . DISCONTD: diltiazem (CARDIZEM) infusion Stopped (11/30/11 0600)   PRN Meds:.acetaminophen, acetaminophen, iohexol, ondansetron (ZOFRAN) IV, ondansetron, DISCONTD: ondansetron  Ht: 5\' 3"  (160 cm)  Wt: 145 lb 4.5 oz (65.9 kg)  Ideal Wt: 115 lb %  Ideal Wt: 126  Usual Wt: 145 lb % Usual Wt: 100  Body mass index is 25.74 kg/(m^2).   Labs:  CMP     Component Value Date/Time   NA 129* 11/30/2011 0533   K 4.5 11/30/2011 0533   CL 95* 11/30/2011 0533   CO2 21 11/30/2011 0533   GLUCOSE 187* 11/30/2011 0533   BUN 11 11/30/2011 0533   CREATININE 0.77 11/30/2011 0533   CALCIUM 9.2 11/30/2011 0533   PROT 5.7* 11/30/2011 0533   ALBUMIN 3.1* 11/30/2011 0533   AST 18 11/30/2011 0533   ALT 14 11/30/2011 0533   ALKPHOS 109 11/30/2011 0533   BILITOT 0.2* 11/30/2011 0533   GFRNONAA 74* 11/30/2011 0533   GFRAA 86* 11/30/2011 0533    Intake/Output Summary (Last 24 hours) at 11/30/11 1508 Last data filed at 11/30/11 1410  Gross per 24 hour  Intake 639.83 ml  Output    701 ml  Net -61.17 ml   Last BM - 11/30/11 per pt report  Diet Order: Cardiac   IVF:    heparin Last Rate: 950 Units/hr (11/30/11 0618)  DISCONTD: diltiazem (CARDIZEM) infusion Last Rate: Stopped (11/30/11 0600)    Estimated Nutritional Needs:   Kcal:1650-1950 Protein:80-95g Fluid:1.6-1.9L  NUTRITION DIAGNOSIS: -Inadequate oral intake (NI-2.1).  Status: Ongoing  RELATED TO: thrush, taste changes  AS EVIDENCE BY: pt statement  MONITORING/EVALUATION(Goals): Pt to consume >75% of meals/supplements.   EDUCATION NEEDS: -Education needs addressed - discussed strategies to help with taste changes and recommended seasonings to add flavor to food. Handout of this information was provided.   INTERVENTION: Boost Plus BID. Hopefully as thrush resolves, pt will eat better. Will monitor.   Dietitian #: [phone]  DOCUMENTATION CODES Per approved criteria  -Not Applicable    Marshall Cork 11/30/2011, 3:02 PM

## 2011-11-30 NOTE — Progress Notes (Signed)
Subjective: Sitting up in bed visiting with son. Reports "feeling better than yesterday, breathing better". Denies pain/discomfort. Reports decreased appetite but denies nausea. Had some diarrhea over last few days but is improving. One episode this morning. None overnight. Denies abdominal pain. No CP.  Objective: Vital signs Filed Vitals:   11/30/11 0012 11/30/11 0415 11/30/11 0512 11/30/11 0615  BP:  98/65 98/62 114/69  Pulse:  89 70 71  Temp:    97.6 F (36.4 C)  TempSrc:    Oral  Resp:    20  Height: 5\' 3"  (1.6 m)     Weight: 65.9 kg (145 lb 4.5 oz)     SpO2:    94%   Weight change:  Last BM Date: 11/29/11  Intake/Output from previous day: 05/22 0701 - 05/23 0700 In: 84 [I.V.:9] Out: 400 [Urine:400] Total I/O In: 315.8 [P.O.:240; I.V.:75.8] Out: -    Physical Exam: General: Alert, awake, oriented x3, in no acute distress. HEENT: No bruits, no goiter. Mucus membranes mouth moist. Slightly red.  Heart: Regular rate and rhythm, without murmurs, rubs, gallops. Lungs:  Normal effort. Breath sounds clear to auscultation bilaterally. No wheeze. Few crackles at the bases but improving with deep breathing. Abdomen: Soft, nontender, nondistended, positive bowel sounds. Palpable hernia. Non tender.  Extremities: No clubbing cyanosis or edema with positive pedal pulses. No calf tenderness Neuro: Grossly intact, nonfocal.   Lab Results: Basic Metabolic Panel:  Basename 11/30/11 0533 11/30/11 0026 11/29/11 1310  NA 129* -- 123*  K 4.5 -- 4.1  CL 95* -- 88*  CO2 21 -- 25  GLUCOSE 187* -- 98  BUN 11 -- 10  CREATININE 0.77 0.82 --  CALCIUM 9.2 -- 9.1  MG -- -- --  PHOS -- -- --   Liver Function Tests:  Basename 11/30/11 0533 11/29/11 1310  AST 18 24  ALT 14 16  ALKPHOS 109 100  BILITOT 0.2* 0.3  PROT 5.7* 6.4  ALBUMIN 3.1* 3.5   No results found for this basename: LIPASE:2,AMYLASE:2 in the last 72 hours No results found for this basename: AMMONIA:2 in the last 72  hours CBC:  Basename 11/30/11 0533 11/30/11 0026 11/29/11 1310  WBC 40.6* 35.6* --  NEUTROABS 37.8* -- 14.2*  HGB 10.3* 11.0* --  HCT 30.9* 33.6* --  MCV 82.8 83.6 --  PLT 152 131* --   Cardiac Enzymes:  Basename 11/30/11 0026  CKTOTAL 68  CKMB 3.7  CKMBINDEX --  TROPONINI <0.30   BNP:  Basename 11/30/11 0026  PROBNP 1916.0*   Thyroid Function Tests:  Basename 11/30/11 0026  TSH 1.509  T4TOTAL --  FREET4 --  T3FREE --  THYROIDAB --   Anemia Panel: No results found for this basename: VITAMINB12,FOLATE,FERRITIN,TIBC,IRON,RETICCTPCT in the last 72 hours Coagulation:  Basename 11/29/11 1350  LABPROT 13.0  INR 0.96   Urinalysis:  Basename 11/30/11 0343  COLORURINE YELLOW  LABSPEC 1.018  PHURINE 7.0  GLUCOSEU 250*  HGBUR TRACE*  BILIRUBINUR NEGATIVE  KETONESUR NEGATIVE  PROTEINUR NEGATIVE  UROBILINOGEN 0.2  NITRITE NEGATIVE  LEUKOCYTESUR NEGATIVE   Misc. Labs:  No results found for this or any previous visit (from the past 240 hour(s)).  Studies/Results: Dg Chest 2 View  11/29/2011  *RADIOLOGY REPORT*  Clinical Data: Shortness of breath with weakness.  Atrial fibrillation.  History of uterine cancer.  CHEST - 2 VIEW  Comparison: None.  Findings: The heart size is normal.  There is mild aortic atherosclerosis without focal dilatation.  There is mild atelectasis  at the left lung base.  No edema, confluent airspace opacity or significant pleural effusion is seen.  The bones are diffusely demineralized without apparent focal fracture.  IMPRESSION: Mild left basilar atelectasis.  No consolidation or edema.  Original Report Authenticated By: Gerrianne Scale, M.D.   Ct Angio Chest W/cm &/or Wo Cm  11/29/2011  *RADIOLOGY REPORT*  Clinical Data: Atrial fibrillation, cancer, evaluate for PE  CT ANGIOGRAPHY CHEST  Technique:  Multidetector CT imaging of the chest using the standard protocol during bolus administration of intravenous contrast. Multiplanar  reconstructed images including MIPs were obtained and reviewed to evaluate the vascular anatomy.  Contrast: 80mL OMNIPAQUE IOHEXOL 300 MG/ML  SOLN  Comparison: Chest radiographs dated 11/29/2011  Findings: No evidence of pulmonary embolism.  Mild subpleural nodularity in the anterior right upper lobe, possibly pleural parenchymal scarring (series 12/image 11). Dependent atelectasis in the bilateral lower lobes.  No pleural effusion or pneumothorax.  Mild cardiomegaly.  No pericardial effusion.  Coronary atherosclerosis.  Mild atherosclerotic calcifications of the aortic arch.  Enlargement of the bilateral pulmonary arteries, suggesting pulmonary arterial hypertension.  No suspicious mediastinal, hilar, or axillary lymphadenopathy.  Status post left mastectomy.  Visualized upper abdomen is unremarkable.  Degenerative changes of the visualized thoracolumbar spine.  IMPRESSION: No evidence of pulmonary embolism.  Mild cardiomegaly.  Suspected pulmonary arterial hypertension.  Original Report Authenticated By: Charline Bills, M.D.    Medications: Scheduled Meds:   . sodium chloride   Intravenous Once  . aspirin EC  325 mg Oral Daily  . atorvastatin  10 mg Oral q1800  . calcium-vitamin D  2 tablet Oral QHS  . diltiazem  10 mg Intravenous Once  . diltiazem  60 mg Oral Q6H  . diphenhydrAMINE  50 mg Intravenous Once  . fluconazole  100 mg Oral Daily  . fluconazole  100 mg Oral Daily  . heparin  1,500 Units Intravenous Once  . levothyroxine  50 mcg Oral Daily  . loratadine  10 mg Oral Daily  . methylPREDNISolone (SOLU-MEDROL) injection  125 mg Intravenous Once  . nystatin  5 mL Oral QID  . pantoprazole  40 mg Oral Daily  . sodium chloride  500 mL Intravenous Once  . sodium chloride  3 mL Intravenous Q12H  . sodium chloride  3 mL Intravenous Q12H  . DISCONTD: diltiazem (CARDIZEM) infusion  5-15 mg/hr Intravenous Once  . DISCONTD: diltiazem  10 mg Intravenous Once   Continuous Infusions:   .  diltiazem (CARDIZEM) infusion Stopped (11/30/11 0600)  . heparin 950 Units/hr (11/30/11 0618)   PRN Meds:.acetaminophen, acetaminophen, iohexol, ondansetron (ZOFRAN) IV, ondansetron, DISCONTD: ondansetron  Assessment/Plan:  Principal Problem:  *Atrial fibrillation with RVR Active Problems:  Endometrial adenocarcinoma  Peripheral neuropathy  Hypothyroid  Breast cancer  SIADH (syndrome of inappropriate ADH production)  SOB (shortness of breath)   Atrial fibrillation with RVR and shortness of breath Etiology unclear. Patient's shortness of breath could be related to the atrial fibrillation with RVR. CT angiogram chest was negative for PE but has features of cardiomegaly and pulmonary hypertension.  2-D echo pending. Patient's CHADS2 score is 2 due to age and history of HTN. Would benefit from Warfarin as she has a good QOL currently. No history of falls, bleeding, hemorrhagic strokes. Continue Heparin drip until results of ECHo is available. BNP 1916. CE neg, tsh 1.5. Can follow up with her cardiologist at [facility] next week as scheduled.  Hyponatremia with history of SIADH Dr. Precious Reel notes clearly mentions patient has SIADH.  Her hyponatremia is probably from SIADH.Trending up. Will continue to restrict fluids to 1 L per day. Urine sodium 46. Urine osmolality pending. Closely follow metabolic panel.   Significant leukocytosis Patient did get Neulasta per patient's son last week after chemotherapy which could be causing the leukocytosis. Trending upward this am. Patient does not look septic and is afebrile. CT chest did not show any infective process.Urinalysis results not consistent with UTI. Closely follow CBC.   Mild anemia and thrombocytopenia Likely related to cancer treatments. Anemia stable. No s/sx active bleeding. Thrombocytopenia improved. Closely follow CBC.   Diarrhea Probably related to chemotherapy. C-diff pending. Improved.  Hypothyroidism TSH 1.5. Continue  Synthroid.   History of hyperlipidemia Continue statins.   History of uterine cancer and breast cancer on Chemo Per oncology.   Oral thrush Being treated with fluconazole  Disposition Lives alone in [place]. Has 4 sons with strong support. Will get PT/OT eval. Has cardiologist Dr. Rozanna Boer and has an appointment next week.   Full Code  DVt prophylaxis On Heparin Infusion   LOS: 1 day   [facility] M 11/30/2011, 8:39 AM  Patient is seen and examined. Agree with above note with changes as noted. Discussed in detail with patient and her Son. Await ECHO. Would initiate Warfarin once ECHO results are available. No need for cardiology consultation at this time. She can see her own cardiologist after discharge.  Rami Waddle 10:17 AM

## 2011-11-30 NOTE — Progress Notes (Signed)
   CARE MANAGEMENT NOTE 11/30/2011  Patient:  Kaylee Pugh,Kaylee Pugh   Account Number:  [id number]  Date Initiated:  11/30/2011  Documentation initiated by:  Jiles Crocker  Subjective/Objective Assessment:   ADMITTED WITH ATRIAL FIB WITH RVR     Action/Plan:   PCP - in [place].  Oncologist - Dr.Livesay. LIVES ALONE AT HOME   Anticipated DC Date:  12/04/2011   Anticipated DC Plan:  HOME/SELF CARE          Status of service:  In process, will continue to follow Medicare Important Message given?  NA - LOS <3 / Initial given by admissions (If response is "NO", the following Medicare IM given date fields will be blank)  Per UR Regulation:  Reviewed for med. necessity/level of care/duration of stay   Comments:  11/30/2011- B Tevis Conger RN, BSN, MHA

## 2011-11-30 NOTE — Progress Notes (Signed)
Pt's HR 63-72, BP - 98/62. MD notified, new orders to d/c drip and to reassess BP for 0600 po cardizem. Will continue to closely monitor patient.  Earnest Conroy. Clelia Croft, RN

## 2011-11-30 NOTE — Progress Notes (Signed)
Assumed care of pt at this time. Pt sleeping, but easily arousable, denies any complaints at this time. Reviewed and agree with previous nurses assessment. Will continue to monitor and care for this patient.   Earnest Conroy. Clelia Croft, RN

## 2011-11-30 NOTE — Progress Notes (Signed)
  Echocardiogram 2D Echocardiogram has been performed.  Kaylee Pugh L 11/30/2011, 3:44 PM 

## 2011-11-30 NOTE — Progress Notes (Signed)
ANTICOAGULATION CONSULT NOTE - Follow Up Consult  Pharmacy Consult for heparin Indication: atrial fibrillation  Allergies  Allergen Reactions  . Iodine Swelling and Rash    Iodine contrast    Patient Measurements: Height: 5\' 3"  (160 cm) Weight: 145 lb 4.5 oz (65.9 kg) IBW/kg (Calculated) : 52.4  Heparin Dosing Weight:   Vital Signs: Temp: 97.6 F (36.4 C) (05/23 0615) Temp src: Oral (05/23 0615) BP: 114/69 mmHg (05/23 0615) Pulse Rate: 71  (05/23 0615)  Labs:  Basename 11/30/11 0533 11/30/11 0026 11/29/11 1350 11/29/11 1310  HGB 10.3* 11.0* -- --  HCT 30.9* 33.6* -- 34.9*  PLT 152 131* -- 149*  APTT -- -- 30 --  LABPROT -- -- 13.0 --  INR -- -- 0.96 --  HEPARINUNFRC 0.30 -- -- --  CREATININE 0.77 0.82 -- 0.83  CKTOTAL -- 68 -- --  CKMB -- 3.7 -- --  TROPONINI -- <0.30 -- --    Estimated Creatinine Clearance: 46.9 ml/min (by C-G formula based on Cr of 0.77).   Medications:  Infusions:    . diltiazem (CARDIZEM) infusion Stopped (11/30/11 0600)  . heparin 950 Units/hr (11/30/11 0618)    Assessment: Patient with heparin level at goal, but low value within goal.  No issues per RN.  Goal of Therapy:  Heparin level 0.3-0.7 units/ml Monitor platelets by anticoagulation protocol: Yes   Plan:  Increase heparin to 950 units/hr to assure remains within goal.  Recheck level at 1200.  Darlina Guys, Jacquenette Shone Crowford 11/30/2011,6:31 AM

## 2011-12-01 ENCOUNTER — Encounter (HOSPITAL_COMMUNITY): Payer: Self-pay | Admitting: *Deleted

## 2011-12-01 ENCOUNTER — Encounter (HOSPITAL_COMMUNITY)
Admission: EM | Disposition: A | Discharge status: Home / Self Care | Payer: Self-pay | Source: Home / Self Care | Attending: Internal Medicine

## 2011-12-01 ENCOUNTER — Inpatient Hospital Stay (HOSPITAL_COMMUNITY): Payer: Medicare Other | Admitting: *Deleted

## 2011-12-01 DIAGNOSIS — E871 Hypo-osmolality and hyponatremia: Secondary | ICD-10-CM

## 2011-12-01 DIAGNOSIS — I4891 Unspecified atrial fibrillation: Secondary | ICD-10-CM

## 2011-12-01 DIAGNOSIS — D72829 Elevated white blood cell count, unspecified: Secondary | ICD-10-CM

## 2011-12-01 HISTORY — PX: TEE WITHOUT CARDIOVERSION: SHX5443

## 2011-12-01 HISTORY — PX: CARDIOVERSION: SHX1299

## 2011-12-01 LAB — CBC
Hemoglobin: 9.8 g/dL — ABNORMAL LOW (ref 12.0–15.0)
MCH: 27.8 pg (ref 26.0–34.0)
Platelets: 142 10*3/uL — ABNORMAL LOW (ref 150–400)
RBC: 3.53 MIL/uL — ABNORMAL LOW (ref 3.87–5.11)
WBC: 33.5 10*3/uL — ABNORMAL HIGH (ref 4.0–10.5)

## 2011-12-01 LAB — BASIC METABOLIC PANEL
CO2: 25 mEq/L (ref 19–32)
Calcium: 8.9 mg/dL (ref 8.4–10.5)
Chloride: 95 mEq/L — ABNORMAL LOW (ref 96–112)
Glucose, Bld: 108 mg/dL — ABNORMAL HIGH (ref 70–99)
Potassium: 4.3 mEq/L (ref 3.5–5.1)
Sodium: 130 mEq/L — ABNORMAL LOW (ref 135–145)

## 2011-12-01 SURGERY — ECHOCARDIOGRAM, TRANSESOPHAGEAL
Anesthesia: General

## 2011-12-01 SURGERY — ECHOCARDIOGRAM, TRANSESOPHAGEAL
Anesthesia: Moderate Sedation

## 2011-12-01 MED ORDER — DILTIAZEM HCL ER COATED BEADS 120 MG PO CP24
120.0000 mg | ORAL_CAPSULE | Freq: Once | ORAL | Status: AC
  Administered 2011-12-01: 120 mg via ORAL
  Filled 2011-12-01 (×2): qty 1

## 2011-12-01 MED ORDER — SODIUM CHLORIDE 0.9 % IJ SOLN: 3.0000 mL | Freq: Two times a day (BID) | INTRAMUSCULAR | Status: DC

## 2011-12-01 MED ORDER — BENZOCAINE 20 % MT SOLN
1.0000 "application " | OROMUCOSAL | Status: DC | PRN
  Filled 2011-12-01: qty 57

## 2011-12-01 MED ORDER — RIVAROXABAN 10 MG PO TABS: 20.0000 mg | ORAL_TABLET | Freq: Every day | ORAL | Status: DC

## 2011-12-01 MED ORDER — RIVAROXABAN 15 MG PO TABS: 15.0000 mg | ORAL_TABLET | Freq: Every day | ORAL | Status: DC

## 2011-12-01 MED ORDER — FENTANYL CITRATE 0.05 MG/ML IJ SOLN: 250.0000 ug | Freq: Once | INTRAMUSCULAR | Status: DC

## 2011-12-01 MED ORDER — MIDAZOLAM HCL 10 MG/2ML IJ SOLN
INTRAMUSCULAR | Status: DC | PRN
  Administered 2011-12-01 (×2): 1 mg via INTRAVENOUS

## 2011-12-01 MED ORDER — DILTIAZEM HCL ER COATED BEADS 180 MG PO CP24: 180.0000 mg | ORAL_CAPSULE | Freq: Once | ORAL | Status: DC

## 2011-12-01 MED ORDER — SODIUM CHLORIDE 0.9 % IV SOLN: 250.0000 mL | INTRAVENOUS | Status: DC

## 2011-12-01 MED ORDER — SODIUM CHLORIDE 0.9 % IJ SOLN: 3.0000 mL | INTRAMUSCULAR | Status: DC | PRN

## 2011-12-01 MED ORDER — RIVAROXABAN 15 MG PO TABS
15.0000 mg | ORAL_TABLET | Freq: Every day | ORAL | Status: DC
  Administered 2011-12-01: 15 mg via ORAL
  Filled 2011-12-01: qty 1

## 2011-12-01 MED ORDER — PROPOFOL 10 MG/ML IV EMUL
INTRAVENOUS | Status: DC | PRN
  Administered 2011-12-01: 60 mg via INTRAVENOUS

## 2011-12-01 MED ORDER — HYDROCORTISONE 1 % EX CREA
1.0000 "application " | TOPICAL_CREAM | Freq: Three times a day (TID) | CUTANEOUS | Status: DC | PRN
  Filled 2011-12-01: qty 28

## 2011-12-01 MED ORDER — SODIUM CHLORIDE 0.45 % IV SOLN: INTRAVENOUS | Status: DC

## 2011-12-01 MED ORDER — FENTANYL CITRATE 0.05 MG/ML IJ SOLN
INTRAMUSCULAR | Status: DC | PRN
  Administered 2011-12-01 (×3): 25 ug via INTRAVENOUS

## 2011-12-01 MED ORDER — MIDAZOLAM HCL 10 MG/2ML IJ SOLN: 10.0000 mg | Freq: Once | INTRAMUSCULAR | Status: DC

## 2011-12-01 MED ORDER — SODIUM CHLORIDE 0.9 % IV SOLN
INTRAVENOUS | Status: DC | PRN
  Administered 2011-12-01: 12:00:00 via INTRAVENOUS

## 2011-12-01 MED ORDER — RIVAROXABAN 20 MG PO TABS: 20.0000 mg | ORAL_TABLET | Freq: Every day | ORAL | Status: DC

## 2011-12-01 MED ORDER — BUTAMBEN-TETRACAINE-BENZOCAINE 2-2-14 % EX AERO
INHALATION_SPRAY | CUTANEOUS | Status: DC | PRN
  Administered 2011-12-01: 2 via TOPICAL

## 2011-12-01 MED ORDER — HEPARIN (PORCINE) IN NACL 100-0.45 UNIT/ML-% IJ SOLN
950.0000 [IU]/h | INTRAMUSCULAR | Status: DC
  Filled 2011-12-01: qty 250

## 2011-12-01 MED ORDER — LORAZEPAM 1 MG PO TABS
1.0000 mg | ORAL_TABLET | Freq: Once | ORAL | Status: AC
  Administered 2011-12-01: 1 mg via ORAL
  Filled 2011-12-01: qty 1

## 2011-12-01 MED ORDER — SODIUM CHLORIDE 0.9 % IV SOLN: 250.0000 mL | INTRAVENOUS | Status: DC | PRN

## 2011-12-01 NOTE — Discharge Summary (Addendum)
Physician Discharge Summary  Patient ID: Kaylee Pugh MRN: [id number] DOB/AGE: 03/29/1925 78 y.o.  Admit date: 11/29/2011 Discharge date: 12/01/2011  PCP: Vickey Sages, MD, MD  Discharge Diagnoses:  Principal Problem:  *Atrial fibrillation with RVR Active Problems:  Endometrial adenocarcinoma  Peripheral neuropathy  Hypothyroid  Breast cancer  SIADH (syndrome of inappropriate ADH production)  SOB (shortness of breath)  Thrush  Consultants: Dr. Sharyn Lull with cardiology, Dr. Algie Coffer performed TEE. Dr. Darrold Span  Discharged Condition: fair  Initial History: 78 year-old female with recent diagnosis of uterine cancer status post hysterectomy at [facility] and is receiving chemotherapy in [place], the first cycle was last week had gone to followup with Dr. Darrold Span [facility] she was found to be tachycardic. Patient was referred to the ER and was found to have atrial fibrillation with RVR. Patient was started on Cardizem drip after bolus and patient was admitted for further management. Patient stated over the last 2-3 days prior to admission she has been having shortness of breath both exertional and at rest with occasional palpitations. Patient did have similar symptoms last July and had undergone cardiac catheterization at [facility] and as per patient's son it was normal. Since then she did not have further episodes of shortness of breath or palpitations. Denied any chest pain, fever chills cough or productive sputum. She did have some diarrhea around 2-3 times daily since Monday 3 days ago. Denies any abdominal pain nausea or vomiting. Patient after the chemotherapy did get Neulasta as per patient's son.  Hospital Course:   Atrial fibrillation with RVR and shortness of breath  Etiology remains unclear. Patient's shortness of breath was probably related to the atrial fibrillation with RVR. Once her HR got under control her symptoms  improved. CT angiogram chest was negative for PE but has features of cardiomegaly and pulmonary hypertension. 2-D echo showed normal EF but suggested a mass in the right atrium. Dr. Sharyn Lull was consulted and a TEE was done. This did not reveal any clot but possible calcification was noted on the wall. Patient to discuss this further with her cardiologist. Cardioversion was also attempted but was not successful. Patient's CHADS2 score is 2 due to age and history of HTN. No history of falls, bleeding, hemorrhagic strokes. Discussed anticoagulation with Dr. Sharyn Lull and he recommends either Warfarin or Xarelto. Discussed with patient and family and they prefer Xarelto. Risks of anticoagulation discussed. BNP 1916. CE neg, tsh 1.5. Can follow up with her cardiologist at [facility] next week as scheduled.   Hyponatremia with history of SIADH  Dr. Precious Reel notes clearly mentions patient has SIADH. Her hyponatremia is probably from SIADH. Sodium levels trended upwards and is at 130 today.   Significant leukocytosis  WBC was upto 40.6. Patient did get Neulasta per patient's son last week after chemotherapy which could be causing the leukocytosis. WBC is reducing. Patient remains afebrile and has no signs or symptoms of infection.   Mild anemia and thrombocytopenia  Likely related to cancer treatments. Anemia stable. No s/sx active bleeding. Thrombocytopenia improved.    Diarrhea  Probably related to chemotherapy. C-diff was negative. Improved.   Hypothyroidism  TSH 1.5. Continue Synthroid.   History of hyperlipidemia  Continue statins.   History of uterine cancer and breast cancer on Chemo  Plan is per oncology. She has an appointment next week.  Oral thrush  Being treated with fluconazole   Disposition  Lives alone in [place]. Has 4 sons with strong  support.   Patient is stable for discharge and she is keen on going home.  PERTINENT LABS  Sodium was initially 123 and improved to 130.  WBC was 18 increasing to 40 and now 33.  IMAGING STUDIES Dg Chest 2 View  11/29/2011  *RADIOLOGY REPORT*  Clinical Data: Shortness of breath with weakness.  Atrial fibrillation.  History of uterine cancer.  CHEST - 2 VIEW  Comparison: None.  Findings: The heart size is normal.  There is mild aortic atherosclerosis without focal dilatation.  There is mild atelectasis at the left lung base.  No edema, confluent airspace opacity or significant pleural effusion is seen.  The bones are diffusely demineralized without apparent focal fracture.  IMPRESSION: Mild left basilar atelectasis.  No consolidation or edema.  Original Report Authenticated By: Gerrianne Scale, M.D.   Ct Angio Chest W/cm &/or Wo Cm  11/29/2011  *RADIOLOGY REPORT*  Clinical Data: Atrial fibrillation, cancer, evaluate for PE  CT ANGIOGRAPHY CHEST  Technique:  Multidetector CT imaging of the chest using the standard protocol during bolus administration of intravenous contrast. Multiplanar reconstructed images including MIPs were obtained and reviewed to evaluate the vascular anatomy.  Contrast: 80mL OMNIPAQUE IOHEXOL 300 MG/ML  SOLN  Comparison: Chest radiographs dated 11/29/2011  Findings: No evidence of pulmonary embolism.  Mild subpleural nodularity in the anterior right upper lobe, possibly pleural parenchymal scarring (series 12/image 11). Dependent atelectasis in the bilateral lower lobes.  No pleural effusion or pneumothorax.  Mild cardiomegaly.  No pericardial effusion.  Coronary atherosclerosis.  Mild atherosclerotic calcifications of the aortic arch.  Enlargement of the bilateral pulmonary arteries, suggesting pulmonary arterial hypertension.  No suspicious mediastinal, hilar, or axillary lymphadenopathy.  Status post left mastectomy.  Visualized upper abdomen is unremarkable.  Degenerative changes of the visualized thoracolumbar spine.  IMPRESSION: No evidence of pulmonary embolism.  Mild cardiomegaly.  Suspected pulmonary arterial  hypertension.  Original Report Authenticated By: Charline Bills, M.D.    Discharge Exam: Blood pressure 120/75, pulse 65, temperature 97.4 F (36.3 C), temperature source Oral, resp. rate 16, height 5\' 3"  (1.6 m), weight 66.2 kg (145 lb 15.1 oz), SpO2 96.00%. General appearance: alert, cooperative and no distress Head: Normocephalic, without obvious abnormality, atraumatic Resp: clear to auscultation bilaterally Cardio: regular rate and rhythm, S1, S2 normal, no murmur, click, rub or gallop GI: soft, non-tender; bowel sounds normal; no masses,  no organomegaly Extremities: extremities normal, atraumatic, no cyanosis or edema Pulses: 2+ and symmetric Skin: Skin color, texture, turgor normal. No rashes or lesions Lymph nodes: Cervical, supraclavicular, and axillary nodes normal. Neurologic: Grossly normal  Disposition: Home with Son  Discharge Orders    Future Appointments: Provider: Department: Dept Phone: Center:   12/06/2011 10:15 AM Dava Najjar Idelle Jo [facility] [id number] None   12/06/2011 10:45 AM Ottie Glazier Buzzy Han, MD [facility] [phone] None   12/13/2011 11:45 AM [facility] [id number] None   01/03/2012 11:00 AM [facility] E15 [facility] [id number] None   01/24/2012 11:00 AM [facility] C10 [facility] [id number] None     Future Orders Please Complete By Expires   Diet - low sodium heart healthy      Increase activity slowly      Discharge instructions      Comments:   Be sure to follow up with your cardiologist     Current Discharge Medication List    START taking these medications   Details  diltiazem (CARDIZEM CD) 180 MG 24 hr capsule Take 1 capsule (180  mg total) by mouth once. Starting 12/02/11 Qty: 30 capsule, Refills: 2    Rivaroxaban (XARELTO) 15 MG TABS tablet Take 1 tablet (15 mg total) by mouth daily. Starting tomorrow Qty: 30 tablet, Refills: 0      CONTINUE these medications which have NOT CHANGED   Details    acetaminophen (TYLENOL) 500 MG tablet Take 500 mg by mouth every 6 (six) hours as needed. Pain    Calcium Carbonate-Vitamin D (CALCIUM + D) 600-200 MG-UNIT TABS Take 2 tablets by mouth at bedtime.     dexamethasone (DECADRON) 4 MG tablet Take 4 mg by mouth See admin instructions. Pt takes 2 tabs the day before chemo,2 tabs the day of chemo,2 tabs the day after.    esomeprazole (NEXIUM) 40 MG capsule Take 40 mg by mouth daily before breakfast.    levothyroxine (SYNTHROID, LEVOTHROID) 50 MCG tablet Take 50 mcg by mouth daily.    Loratadine (CLARITIN) 10 MG CAPS Take 10 mg by mouth daily.    ondansetron (ZOFRAN) 4 MG tablet Take 4 mg by mouth every 8 (eight) hours as needed. nausea    PRESCRIPTION MEDICATION Pt gets [facility] at [facility]. Pt's last treatment was on 11-22-11 pt's on every 3 week cycle. Next treatment is  due June 5. Followed by Dr Darrold Span    rosuvastatin (CRESTOR) 5 MG tablet Take 5 mg by mouth daily.    fluconazole (DIFLUCAN) 100 MG tablet Take 2 tabs today, then 1 tab daily for thrush Qty: 8 tablet, Refills: 0   Associated Diagnoses: Breast cancer      STOP taking these medications     aspirin 81 MG tablet      ibuprofen (ADVIL,MOTRIN) 200 MG tablet        Follow-up Information    Follow up with Vickey Sages, MD. Schedule an appointment as soon as possible for a visit in 3 weeks. (post hospitalization follow up)       Follow up with Your Cardiologist. (See as scheduled)          Total Discharge Time: 35 mins  [facility]  [facility] Pager [phone]  12/01/2011, 6:07 PM

## 2011-12-01 NOTE — OR Nursing (Signed)
[facility] arrived to transport pt back to [facility]. Report given to [place], [place]

## 2011-12-01 NOTE — CV Procedure (Signed)
PRE-OP DIAGNOSIS:  Atrial fibrillation.  POST-OP DIAGNOSIS:  Atrial fibrillation.  OPERATOR:  Orpah Cobb MD.     ANESTHESIA:  Propofol 60 mg.  COMPLICATIONS:  None.   OPERATIVE TERM:  DC cardioversion.  The nature of the procedure, risks and alternatives were discussed with the patient who gave informed consent.  OPERATIVE TECHNIQUE:  The patient was sedated with Propofol 60 mg IV.  When the patient was no longer responsive to quiet voice, DC cardioversion was performed with 120 J, 150 J and 200 J biphasically and synchronously.  Sinus rhythm was restored each time for less than 10-20 seconds and then went back into atrial fibrillation.  The patient was then monitored until fully alert and left the procedure area in stable condition.  IMPRESSION:  Unsuccessful DC cardioversion x 3. Patient will be treated with medications for now. Ventricular heart rate in 60's/min post procedure.

## 2011-12-01 NOTE — Anesthesia Postprocedure Evaluation (Signed)
  Anesthesia Post-op Note  Patient: Kaylee Pugh  Procedure(s) Performed: Procedure(s) (LRB): TRANSESOPHAGEAL ECHOCARDIOGRAM (TEE) (N/A) CARDIOVERSION (N/A)  Patient Location: Endoscopy unit  Anesthesia Type: General  Level of Consciousness: awake, alert  and oriented  Airway and Oxygen Therapy: Patient Spontanous Breathing  Post-op Pain: none  Post-op Assessment: Post-op Vital signs reviewed  Post-op Vital Signs: Reviewed  Complications: No apparent anesthesia complications

## 2011-12-01 NOTE — Progress Notes (Signed)
Pt had 2.26 sec pause. Pt asymptomatic and VS stable. Will continue to monitor.  Earnest Conroy. Clelia Croft, RN

## 2011-12-01 NOTE — Consult Note (Signed)
Reason for Consult: Questionable right atrial mass Referring Physician: [facility] hospital[facility]  Kaylee Pugh is an 78 y.o. female.  HPI: Patient is 78 year old female with past medical history significant for hypertension hypercholesteremia degenerative joint disease history of uterine CA status post mastectomy history of CVA of breast was admitted because of progressive increasing shortness of breath for last 3 days and was noted to be in A. fib with RVR patient received IV Cardizem as well as and was started on drip with control of heart rate patient denies any chest pain nausea vomiting diaphoresis. Patient denies any palpitation lightheadedness or syncope. Patient subsequently had 2-D echo done today which showed good LV systolic function but showed questionable right atrial mass versus thrombus. Cardiologic consultation is called for further evaluation of questionable right atrial mass for TEE. Patient had similar symptoms in July her and has seen cardiologist in [place] and subsequently had a left cath her which was okay as per patient.  Past Medical History  Diagnosis Date  . Breast cancer   . Hypertension   . Hyperlipidemia   . Thyroid disease   . Osteoporosis   . Post-menopausal bleeding   . 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 Low sodium     hx of x 2 per pt and son    Past Surgical History  Procedure Date  . Cataract extraction 1987    Left eye  . Cataract extraction 2002    Right eye  . Retinal detachment surgery 2000  . Bladder repair 2001  . Tubal ligation 1966  . Appendectomy 1966  . Mastectomy 2002    left  . Abdominal hysterectomy     Family History  Problem Relation Age of Onset  . Diabetes Mother   . Heart disease Father   . Kidney disease Sister     Social History:  reports that she has never smoked. She has never used smokeless tobacco. She reports that she does not drink alcohol or use illicit drugs.  Allergies:   Allergies  Allergen Reactions  . Iodine Swelling and Rash    Iodine contrast    Medications: I have reviewed the patient's current medications.  Results for orders placed during the hospital encounter of 11/29/11 (from the past 48 hour(s))  CBC     Status: Abnormal   Collection Time   11/29/11  1:10 PM      Component Value Range Comment   WBC 23.3 (*) 4.0 - 10.5 (K/uL) WHITE COUNT CONFIRMED ON SMEAR   RBC 4.20  3.87 - 5.11 (MIL/uL)    Hemoglobin 11.8 (*) 12.0 - 15.0 (g/dL)    HCT 19.1 (*) 47.8 - 46.0 (%)    MCV 83.1  78.0 - 100.0 (fL)    MCH 28.1  26.0 - 34.0 (pg)    MCHC 33.8  30.0 - 36.0 (g/dL)    RDW 29.5  62.1 - 30.8 (%)    Platelets 149 (*) 150 - 400 (K/uL)   DIFFERENTIAL     Status: Abnormal   Collection Time   11/29/11  1:10 PM      Component Value Range Comment   Neutrophils Relative 61  43 - 77 (%)    Lymphocytes Relative 25  12 - 46 (%)    Monocytes Relative 14 (*) 3 - 12 (%)    Eosinophils Relative 0  0 - 5 (%)    Basophils Relative 0  0 - 1 (%)  Band Neutrophils 0  0 - 10 (%)    Metamyelocytes Relative 0      Myelocytes 0      Promyelocytes Absolute 0      Blasts 0      nRBC 0  0 (/100 WBC)    Neutro Abs 14.2 (*) 1.7 - 7.7 (K/uL)    Lymphs Abs 5.8 (*) 0.7 - 4.0 (K/uL)    Monocytes Absolute 3.3 (*) 0.1 - 1.0 (K/uL)    Eosinophils Absolute 0.0  0.0 - 0.7 (K/uL)    Basophils Absolute 0.0  0.0 - 0.1 (K/uL)    WBC Morphology        Value: MODERATE LEFT SHIFT (>5% METAS AND MYELOS,OCC PRO NOTED)  COMPREHENSIVE METABOLIC PANEL     Status: Abnormal   Collection Time   11/29/11  1:10 PM      Component Value Range Comment   Sodium 123 (*) 135 - 145 (mEq/L)    Potassium 4.1  3.5 - 5.1 (mEq/L)    Chloride 88 (*) 96 - 112 (mEq/L)    CO2 25  19 - 32 (mEq/L)    Glucose, Bld 98  70 - 99 (mg/dL)    BUN 10  6 - 23 (mg/dL)    Creatinine, Ser 9.14  0.50 - 1.10 (mg/dL)    Calcium 9.1  8.4 - 10.5 (mg/dL)    Total Protein 6.4  6.0 - 8.3 (g/dL)    Albumin 3.5  3.5 - 5.2  (g/dL)    AST 24  0 - 37 (U/L)    ALT 16  0 - 35 (U/L)    Alkaline Phosphatase 100  39 - 117 (U/L)    Total Bilirubin 0.3  0.3 - 1.2 (mg/dL)    GFR calc non Af Amer 63 (*) >90 (mL/min)    GFR calc Af Amer 72 (*) >90 (mL/min)   POCT I-STAT TROPONIN I     Status: Normal   Collection Time   11/29/11  1:21 PM      Component Value Range Comment   Troponin i, poc 0.01  0.00 - 0.08 (ng/mL)    Comment 3            APTT     Status: Normal   Collection Time   11/29/11  1:50 PM      Component Value Range Comment   aPTT 30  24 - 37 (seconds)   PROTIME-INR     Status: Normal   Collection Time   11/29/11  1:50 PM      Component Value Range Comment   Prothrombin Time 13.0  11.6 - 15.2 (seconds)    INR 0.96  0.00 - 1.49    PRO B NATRIURETIC PEPTIDE     Status: Abnormal   Collection Time   11/30/11 12:26 AM      Component Value Range Comment   Pro B Natriuretic peptide (BNP) 1916.0 (*) 0 - 450 (pg/mL)   CARDIAC PANEL(CRET KIN+CKTOT+MB+TROPI)     Status: Normal   Collection Time   11/30/11 12:26 AM      Component Value Range Comment   Total CK 68  7 - 177 (U/L)    CK, MB 3.7  0.3 - 4.0 (ng/mL)    Troponin I <0.30  <0.30 (ng/mL)    Relative Index RELATIVE INDEX IS INVALID  0.0 - 2.5    TSH     Status: Normal   Collection Time   11/30/11 12:26 AM  Component Value Range Comment   TSH 1.509  0.350 - 4.500 (uIU/mL)   CBC     Status: Abnormal   Collection Time   11/30/11 12:26 AM      Component Value Range Comment   WBC 35.6 (*) 4.0 - 10.5 (K/uL)    RBC 4.02  3.87 - 5.11 (MIL/uL)    Hemoglobin 11.0 (*) 12.0 - 15.0 (g/dL)    HCT 16.1 (*) 09.6 - 46.0 (%)    MCV 83.6  78.0 - 100.0 (fL)    MCH 27.4  26.0 - 34.0 (pg)    MCHC 32.7  30.0 - 36.0 (g/dL)    RDW 04.5  40.9 - 81.1 (%)    Platelets 131 (*) 150 - 400 (K/uL)   CREATININE, SERUM     Status: Abnormal   Collection Time   11/30/11 12:26 AM      Component Value Range Comment   Creatinine, Ser 0.82  0.50 - 1.10 (mg/dL)    GFR calc non Af  Amer 63 (*) >90 (mL/min)    GFR calc Af Amer 74 (*) >90 (mL/min)   SODIUM, URINE, RANDOM     Status: Normal   Collection Time   11/30/11  3:43 AM      Component Value Range Comment   Sodium, Ur 46     OSMOLALITY, URINE     Status: Abnormal   Collection Time   11/30/11  3:43 AM      Component Value Range Comment   Osmolality, Ur 324 (*) 390 - 1090 (mOsm/kg)   URINALYSIS, ROUTINE W REFLEX MICROSCOPIC     Status: Abnormal   Collection Time   11/30/11  3:43 AM      Component Value Range Comment   Color, Urine YELLOW  YELLOW     APPearance CLEAR  CLEAR     Specific Gravity, Urine 1.018  1.005 - 1.030     pH 7.0  5.0 - 8.0     Glucose, UA 250 (*) NEGATIVE (mg/dL)    Hgb urine dipstick TRACE (*) NEGATIVE     Bilirubin Urine NEGATIVE  NEGATIVE     Ketones, ur NEGATIVE  NEGATIVE (mg/dL)    Protein, ur NEGATIVE  NEGATIVE (mg/dL)    Urobilinogen, UA 0.2  0.0 - 1.0 (mg/dL)    Nitrite NEGATIVE  NEGATIVE     Leukocytes, UA NEGATIVE  NEGATIVE    URINE MICROSCOPIC-ADD ON     Status: Abnormal   Collection Time   11/30/11  3:43 AM      Component Value Range Comment   Squamous Epithelial / LPF RARE  RARE     WBC, UA 0-2  <3 (WBC/hpf)    RBC / HPF 0-2  <3 (RBC/hpf)    Bacteria, UA FEW (*) RARE    HEPARIN LEVEL (UNFRACTIONATED)     Status: Normal   Collection Time   11/30/11  5:33 AM      Component Value Range Comment   Heparin Unfractionated 0.30  0.30 - 0.70 (IU/mL)   CBC     Status: Abnormal   Collection Time   11/30/11  5:33 AM      Component Value Range Comment   WBC 40.6 (*) 4.0 - 10.5 (K/uL)    RBC 3.73 (*) 3.87 - 5.11 (MIL/uL)    Hemoglobin 10.3 (*) 12.0 - 15.0 (g/dL)    HCT 91.4 (*) 78.2 - 46.0 (%)    MCV 82.8  78.0 - 100.0 (fL)    MCH  27.6  26.0 - 34.0 (pg)    MCHC 33.3  30.0 - 36.0 (g/dL)    RDW 16.1  09.6 - 04.5 (%)    Platelets 152  150 - 400 (K/uL)   COMPREHENSIVE METABOLIC PANEL     Status: Abnormal   Collection Time   11/30/11  5:33 AM      Component Value Range Comment    Sodium 129 (*) 135 - 145 (mEq/L)    Potassium 4.5  3.5 - 5.1 (mEq/L)    Chloride 95 (*) 96 - 112 (mEq/L)    CO2 21  19 - 32 (mEq/L)    Glucose, Bld 187 (*) 70 - 99 (mg/dL)    BUN 11  6 - 23 (mg/dL)    Creatinine, Ser 4.09  0.50 - 1.10 (mg/dL)    Calcium 9.2  8.4 - 10.5 (mg/dL)    Total Protein 5.7 (*) 6.0 - 8.3 (g/dL)    Albumin 3.1 (*) 3.5 - 5.2 (g/dL)    AST 18  0 - 37 (U/L)    ALT 14  0 - 35 (U/L)    Alkaline Phosphatase 109  39 - 117 (U/L)    Total Bilirubin 0.2 (*) 0.3 - 1.2 (mg/dL)    GFR calc non Af Amer 74 (*) >90 (mL/min)    GFR calc Af Amer 86 (*) >90 (mL/min)   DIFFERENTIAL     Status: Abnormal   Collection Time   11/30/11  5:33 AM      Component Value Range Comment   Neutrophils Relative 83 (*) 43 - 77 (%)    Lymphocytes Relative 5 (*) 12 - 46 (%)    Monocytes Relative 1 (*) 3 - 12 (%)    Eosinophils Relative 0  0 - 5 (%)    Basophils Relative 1  0 - 1 (%)    Band Neutrophils 7  0 - 10 (%)    Metamyelocytes Relative 2      Myelocytes 1      Promyelocytes Absolute 0      Blasts 0      nRBC 0  0 (/100 WBC)    Neutro Abs 37.8 (*) 1.7 - 7.7 (K/uL)    Lymphs Abs 2.0  0.7 - 4.0 (K/uL)    Monocytes Absolute 0.4  0.1 - 1.0 (K/uL)    Eosinophils Absolute 0.0  0.0 - 0.7 (K/uL)    Basophils Absolute 0.4 (*) 0.0 - 0.1 (K/uL)    WBC Morphology MILD LEFT SHIFT (1-5% METAS, OCC MYELO, OCC BANDS)   INCREASED BANDS (>20% BANDS)   Smear Review LARGE PLATELETS PRESENT     CLOSTRIDIUM DIFFICILE BY PCR     Status: Normal   Collection Time   11/30/11  6:41 AM      Component Value Range Comment   C difficile by pcr NEGATIVE  NEGATIVE    CARDIAC PANEL(CRET KIN+CKTOT+MB+TROPI)     Status: Normal   Collection Time   11/30/11  7:45 AM      Component Value Range Comment   Total CK 53  7 - 177 (U/L)    CK, MB 3.2  0.3 - 4.0 (ng/mL)    Troponin I <0.30  <0.30 (ng/mL)    Relative Index RELATIVE INDEX IS INVALID  0.0 - 2.5    HEPARIN LEVEL (UNFRACTIONATED)     Status: Normal    Collection Time   11/30/11  1:05 PM      Component Value Range Comment   Heparin Unfractionated  0.40  0.30 - 0.70 (IU/mL)   CARDIAC PANEL(CRET KIN+CKTOT+MB+TROPI)     Status: Normal   Collection Time   11/30/11  3:53 PM      Component Value Range Comment   Total CK 47  7 - 177 (U/L)    CK, MB 3.2  0.3 - 4.0 (ng/mL)    Troponin I <0.30  <0.30 (ng/mL)    Relative Index RELATIVE INDEX IS INVALID  0.0 - 2.5    HEPARIN LEVEL (UNFRACTIONATED)     Status: Normal   Collection Time   12/01/11  5:30 AM      Component Value Range Comment   Heparin Unfractionated 0.58  0.30 - 0.70 (IU/mL)   CBC     Status: Abnormal   Collection Time   12/01/11  5:30 AM      Component Value Range Comment   WBC 33.5 (*) 4.0 - 10.5 (K/uL) CONSISTENT WITH PREVIOUS RESULT   RBC 3.53 (*) 3.87 - 5.11 (MIL/uL)    Hemoglobin 9.8 (*) 12.0 - 15.0 (g/dL)    HCT 95.6 (*) 21.3 - 46.0 (%)    MCV 84.7  78.0 - 100.0 (fL)    MCH 27.8  26.0 - 34.0 (pg)    MCHC 32.8  30.0 - 36.0 (g/dL)    RDW 08.6  57.8 - 46.9 (%)    Platelets 142 (*) 150 - 400 (K/uL)   BASIC METABOLIC PANEL     Status: Abnormal   Collection Time   12/01/11  5:30 AM      Component Value Range Comment   Sodium 130 (*) 135 - 145 (mEq/L)    Potassium 4.3  3.5 - 5.1 (mEq/L)    Chloride 95 (*) 96 - 112 (mEq/L)    CO2 25  19 - 32 (mEq/L)    Glucose, Bld 108 (*) 70 - 99 (mg/dL)    BUN 15  6 - 23 (mg/dL)    Creatinine, Ser 6.29  0.50 - 1.10 (mg/dL)    Calcium 8.9  8.4 - 10.5 (mg/dL)    GFR calc non Af Amer 55 (*) >90 (mL/min)    GFR calc Af Amer 64 (*) >90 (mL/min)     Dg Chest 2 View  11/29/2011  *RADIOLOGY REPORT*  Clinical Data: Shortness of breath with weakness.  Atrial fibrillation.  History of uterine cancer.  CHEST - 2 VIEW  Comparison: None.  Findings: The heart size is normal.  There is mild aortic atherosclerosis without focal dilatation.  There is mild atelectasis at the left lung base.  No edema, confluent airspace opacity or significant pleural  effusion is seen.  The bones are diffusely demineralized without apparent focal fracture.  IMPRESSION: Mild left basilar atelectasis.  No consolidation or edema.  Original Report Authenticated By: Gerrianne Scale, M.D.   Ct Angio Chest W/cm &/or Wo Cm  11/29/2011  *RADIOLOGY REPORT*  Clinical Data: Atrial fibrillation, cancer, evaluate for PE  CT ANGIOGRAPHY CHEST  Technique:  Multidetector CT imaging of the chest using the standard protocol during bolus administration of intravenous contrast. Multiplanar reconstructed images including MIPs were obtained and reviewed to evaluate the vascular anatomy.  Contrast: 80mL OMNIPAQUE IOHEXOL 300 MG/ML  SOLN  Comparison: Chest radiographs dated 11/29/2011  Findings: No evidence of pulmonary embolism.  Mild subpleural nodularity in the anterior right upper lobe, possibly pleural parenchymal scarring (series 12/image 11). Dependent atelectasis in the bilateral lower lobes.  No pleural effusion or pneumothorax.  Mild cardiomegaly.  No pericardial effusion.  Coronary atherosclerosis.  Mild atherosclerotic calcifications of the aortic arch.  Enlargement of the bilateral pulmonary arteries, suggesting pulmonary arterial hypertension.  No suspicious mediastinal, hilar, or axillary lymphadenopathy.  Status post left mastectomy.  Visualized upper abdomen is unremarkable.  Degenerative changes of the visualized thoracolumbar spine.  IMPRESSION: No evidence of pulmonary embolism.  Mild cardiomegaly.  Suspected pulmonary arterial hypertension.  Original Report Authenticated By: Charline Bills, M.D.    Review of Systems  Constitutional: Negative for fever, chills and weight loss.  HENT: Negative for hearing loss.   Eyes: Negative for blurred vision, double vision and photophobia.  Respiratory: Positive for shortness of breath. Negative for cough, hemoptysis and sputum production.   Cardiovascular: Negative for chest pain, palpitations, orthopnea, claudication and leg  swelling.  Gastrointestinal: Negative for heartburn, nausea, vomiting and abdominal pain.  Neurological: Negative for dizziness, weakness and headaches.   Blood pressure 105/57, pulse 65, temperature 97.8 F (36.6 C), temperature source Oral, resp. rate 20, height 5\' 3"  (1.6 m), weight 66.2 kg (145 lb 15.1 oz), SpO2 95.00%. Physical Exam  Constitutional: She is oriented to person, place, and time. She appears well-developed and well-nourished. No distress.  HENT:  Head: Normocephalic and atraumatic.  Eyes: Conjunctivae are normal. Pupils are equal, round, and reactive to light. Left eye exhibits no discharge. No scleral icterus.  Neck: Normal range of motion. Neck supple. No JVD present. No thyromegaly present.  Cardiovascular:       Irregularly irregular S1-S2 soft 2/6 systolic murmur noted  Respiratory: Effort normal and breath sounds normal. No respiratory distress. She has no wheezes. She has no rales.  GI: Soft. Bowel sounds are normal. She exhibits no distension. There is no tenderness. There is no rebound and no guarding.  Musculoskeletal: She exhibits no edema and no tenderness.  Lymphadenopathy:    She has no cervical adenopathy.  Neurological: She is alert and oriented to person, place, and time.    Assessment/Plan: A. fib with RVR now controlled ventricular response Questionable right atrial mass Hypertension Hypothyroidism Hypercholesteremia Oral thrush History of uterine CA and breast CA Mild anemia and thrombocytopenia Plan Continue present management Will schedule for TEE. Plan  Anuradha Chabot N 12/01/2011, 8:22 AM

## 2011-12-01 NOTE — Transfer of Care (Signed)
Immediate Anesthesia Transfer of Care Note  Patient: Kaylee Pugh  Procedure(s) Performed: Procedure(s) (LRB): TRANSESOPHAGEAL ECHOCARDIOGRAM (TEE) (N/A) CARDIOVERSION (N/A)  Patient Location: Endoscopy Unit  Anesthesia Type: General  Level of Consciousness: awake, oriented and patient cooperative  Airway & Oxygen Therapy: Patient Spontanous Breathing and Patient connected to nasal cannula oxygen  Post-op Assessment: Report given to PACU RN and Post -op Vital signs reviewed and stable  Post vital signs: Reviewed and stable  Complications: No apparent anesthesia complications

## 2011-12-01 NOTE — OR Nursing (Addendum)
Called report to [place], RN at [facility] 1434, carelink called for transport back to [facility]

## 2011-12-01 NOTE — Progress Notes (Signed)
1247 - Time out performed.  Dr. Algie Coffer performed cardioversion.  Dr. Ivin Booty with anesthesia present.  1248 - shocked @ 150j - not cardioverted. 1250 - shocked @ 200j - not cardioverted. 1251 - shocked @ 200j - not cardioverted.  Morrie Sheldon, RN, CRNA gave report [phone].

## 2011-12-01 NOTE — Progress Notes (Signed)
  Echocardiogram Echocardiogram Transesophageal has been performed.  Mercy Moore 12/01/2011, 11:29 AM

## 2011-12-01 NOTE — OR Nursing (Signed)
Called carelink for transport back to [facility] [id number]

## 2011-12-01 NOTE — CV Procedure (Signed)
INDICATIONS:   The patient is 78 years old white female with mass in RA on transthoracic echocardiogram..  PROCEDURE:  Informed consent was discussed including risks, benefits and alternatives for the procedure.  Risks include, but are not limited to, cough, sore throat, vomiting, nausea, somnolence, esophageal and stomach trauma or perforation, bleeding, low blood pressure, aspiration, pneumonia, infection, trauma to the teeth and death.    Patient was given sedation.  The oropharynx was anesthetized with topical lidocaine.  The transesophageal probe was inserted in the esophagus and stomach and multiple views were obtained.  Agitated saline was used after the transesophageal probe was removed from the body.  The patient was kept under observation until the patient left the procedure room.  The patient left the procedure room in stable condition.   COMPLICATIONS:  There were no immediate complications.  FINDINGS:  1. LEFT VENTRICLE: The left ventricle is normal in structure and function.  Wall motion is normal.  No thrombus or masses seen in the left ventricle.  2. RIGHT VENTRICLE:  The right ventricle is normal in structure and function without any thrombus or masses. Prominent moderator band. Linear echodense shadow across RV posterior wall.   3. LEFT ATRIUM:  The left atrium is normal without any thrombus or masses.  4. LEFT ATRIAL APPENDAGE:  The left atrial appendage is free of any thrombus or masses.  5. RIGHT ATRIUM:  The right atrium is free of any thrombus. Irregular shaped echodensity similar to calcification on right free wall extending posteriorly in linear  Fashion. Thrombus or vegetation is less likely.  6. ATRIAL SEPTUM:  The atrial septum is normal without any ASD or PFO. Negative bubble study for PFO.  7. MITRAL VALVE:  The mitral valve is normal in structure and function with multiple jets of moderate regurgitation. No masses, stenosis or vegetations.  8. TRICUSPID VALVE:   The tricuspid valve is normal in structure and function with eccentric mild regurgitation. No masses, stenosis or vegetations.  9. AORTIC VALVE:  The aortic valve is normal in structure and function without regurgitation, masses, stenosis or vegetations.   10. PULMONIC VALVE:  The pulmonic valve is normal in structure and function without regurgitation, masses, stenosis or vegetations.  11. AORTIC ARCH, ASCENDING AND DESCENDING AORTA:  The aorta had mild atherosclerosis in the ascending and descending aorta including aortic arch.  IMPRESSION:   Mild TR, moderate MR. Non-specific highly echodense shadow(calcification/mass)  RECOMMENDATIONS:    Consider cardioversion for new onset atrial fibrillation(rate controlled and INR at therapeutic level).  Discussed care with patient and son and they are not interested in biopsy or further investigation.

## 2011-12-01 NOTE — Discharge Instructions (Signed)
Atrial Fibrillation Your caregiver has diagnosed you with atrial fibrillation (AFib). The heart normally beats very regularly; AFib is a type of irregular heartbeat. The heart rate may be faster or slower than normal. This can prevent your heart from pumping as well as it should. AFib can be constant (chronic) or intermittent (paroxysmal). CAUSES  Atrial fibrillation may be caused by:  Heart disease, including heart attack, coronary artery disease, heart failure, diseases of the heart valves, and others.   Blood clot in the lungs (pulmonary embolism).   Pneumonia or other infections.   Chronic lung disease.   Thyroid disease.   Toxins. These include alcohol, some medications (such as decongestant medications or diet pills), and caffeine.  In some people, no cause for AFib can be found. This is referred to as Lone Atrial Fibrillation. SYMPTOMS   Palpitations or a fluttering in your chest.   A vague sense of chest discomfort.   Shortness of breath.   Sudden onset of lightheadedness or weakness.  Sometimes, the first sign of AFib can be a complication of the condition. This could be a stroke or heart failure. DIAGNOSIS  Your description of your condition may make your caregiver suspicious of atrial fibrillation. Your caregiver will examine your pulse to determine if fibrillation is present. An EKG (electrocardiogram) will confirm the diagnosis. Further testing may help determine what caused you to have atrial fibrillation. This may include chest x-ray, echocardiogram, blood tests, or CT scans. PREVENTION  If you have previously had atrial fibrillation, your caregiver may advise you to avoid substances known to cause the condition (such as stimulant medications, and possibly caffeine or alcohol). You may be advised to use medications to prevent recurrence. Proper treatment of any underlying condition is important to help prevent recurrence. PROGNOSIS  Atrial fibrillation does tend to  become a chronic condition over time. It can cause significant complications (see below). Atrial fibrillation is not usually immediately life-threatening, but it can shorten your life expectancy. This seems to be worse in women. If you have lone atrial fibrillation and are under 60 years old, the risk of complications is very low, and life expectancy is not shortened. RISKS AND COMPLICATIONS  Complications of atrial fibrillation can include stroke, chest pain, and heart failure. Your caregiver will recommend treatments for the atrial fibrillation, as well as for any underlying conditions, to help minimize risk of complications. TREATMENT  Treatment for AFib is divided into several categories:  Treatment of any underlying condition.   Converting you out of AFib into a regular (sinus) rhythm.   Controlling rapid heart rate.   Prevention of blood clots and stroke.  Medications and procedures are available to convert your atrial fibrillation to sinus rhythm. However, recent studies have shown that this may not offer you any advantage, and cardiac experts are continuing research and debate on this topic. More important is controlling your rapid heartbeat. The rapid heartbeat causes more symptoms, and places strain on your heart. Your caregiver will advise you on the use of medications that can control your heart rate. Atrial fibrillation is a strong stroke risk. You can lessen this risk by taking blood thinning medications such as Coumadin (warfarin), or sometimes aspirin. These medications need close monitoring by your caregiver. Over-medication can cause bleeding. Too little medication may not protect against stroke. HOME CARE INSTRUCTIONS   If your caregiver prescribed medicine to make your heartbeat more normally, take as directed.   If blood thinners were prescribed by your caregiver, take EXACTLY as directed.     Perform blood tests EXACTLY as directed.   Quit smoking. Smoking increases your  cardiac and lung (pulmonary) risks.   DO NOT drink alcohol.   DO NOT drink caffeinated drinks (e.g. coffee, soda, chocolate, and leaf teas). You may drink decaffeinated coffee, soda or tea.   If you are overweight, you should choose a reduced calorie diet to lose weight. Please see a registered dietitian if you need more information about healthy weight loss. DO NOT USE DIET PILLS as they may aggravate heart problems.   If you have other heart problems that are causing AFib, you may need to eat a low salt, fat, and cholesterol diet. Your caregiver will tell you if this is necessary.   Exercise every day to improve your physical fitness. Stay active unless advised otherwise.   If your caregiver has given you a follow-up appointment, it is very important to keep that appointment. Not keeping the appointment could result in heart failure or stroke. If there is any problem keeping the appointment, you must call back to this facility for assistance.  SEEK MEDICAL CARE IF:  You notice a change in the rate, rhythm or strength of your heartbeat.   You develop an infection or any other change in your overall health status.  SEEK IMMEDIATE MEDICAL CARE IF:   You develop chest pain, abdominal pain, sweating, weakness or feel sick to your stomach (nausea).   You develop shortness of breath.   You develop swollen feet and ankles.   You develop dizziness, numbness, or weakness of your face or limbs, or any change in vision or speech.  MAKE SURE YOU:   Understand these instructions.   Will watch your condition.   Will get help right away if you are not doing well or get worse.  Document Released: 06/26/2005 Document Revised: 06/15/2011 Document Reviewed: 01/29/2008 Exit[employer] Patient Information 2012 [employer]. 

## 2011-12-01 NOTE — Anesthesia Preprocedure Evaluation (Addendum)
Anesthesia Evaluation  Patient identified by MRN, date of birth, ID band Patient awake    Reviewed: Allergy & Precautions, H&P , NPO status , Patient's Chart, lab work & pertinent test results  Airway Mallampati: III TM Distance: >3 FB     Dental  (+) Teeth Intact and Dental Advisory Given,    Pulmonary shortness of breath and with exertion,  breath sounds clear to auscultation        Cardiovascular hypertension, Pt. on medications Rhythm:Irregular Rate:Normal     Neuro/Psych    GI/Hepatic Neg liver ROS, GERD-  Medicated and Controlled,  Endo/Other  negative endocrine ROSHypothyroidism   Renal/GU negative Renal ROS     Musculoskeletal negative musculoskeletal ROS (+)   Abdominal Normal abdominal exam  (+)   Peds  Hematology negative hematology ROS (+)   Anesthesia Other Findings   Reproductive/Obstetrics                          Anesthesia Physical Anesthesia Plan  ASA: III  Anesthesia Plan: General   Post-op Pain Management:    Induction: Intravenous  Airway Management Planned: Mask  Additional Equipment:   Intra-op Plan:   Post-operative Plan:   Informed Consent: I have reviewed the patients History and Physical, chart, labs and discussed the procedure including the risks, benefits and alternatives for the proposed anesthesia with the patient or authorized representative who has indicated his/her understanding and acceptance.   Dental advisory given  Plan Discussed with: Anesthesiologist, Surgeon and CRNA  Anesthesia Plan Comments:        Anesthesia Quick Evaluation

## 2011-12-01 NOTE — Progress Notes (Signed)
ANTICOAGULATION CONSULT NOTE - Follow Up Consult  Pharmacy Consult for heparin Indication: atrial fibrillation  Allergies  Allergen Reactions  . Iodine Swelling and Rash    Iodine contrast    Patient Measurements: Height: 5\' 3"  (160 cm) Weight: 145 lb 15.1 oz (66.2 kg) IBW/kg (Calculated) : 52.4   Vital Signs: Temp: 97.8 F (36.6 C) (05/24 0634) Temp src: Oral (05/24 0634) BP: 105/57 mmHg (05/24 0634) Pulse Rate: 65  (05/24 0634)  Labs:  Basename 12/01/11 0530 11/30/11 1553 11/30/11 1305 11/30/11 0745 11/30/11 0533 11/30/11 0026 11/29/11 1350  HGB 9.8* -- -- -- 10.3* -- --  HCT 29.9* -- -- -- 30.9* 33.6* --  PLT 142* -- -- -- 152 131* --  APTT -- -- -- -- -- -- 30  LABPROT -- -- -- -- -- -- 13.0  INR -- -- -- -- -- -- 0.96  HEPARINUNFRC 0.58 -- 0.40 -- 0.30 -- --  CREATININE 0.92 -- -- -- 0.77 0.82 --  CKTOTAL -- 47 -- 53 -- 68 --  CKMB -- 3.2 -- 3.2 -- 3.7 --  TROPONINI -- <0.30 -- <0.30 -- <0.30 --    Estimated Creatinine Clearance: 40.9 ml/min (by C-G formula based on Cr of 0.92).   Medications:  Infusions:     . heparin 950 Units/hr (11/30/11 0618)  . DISCONTD: diltiazem (CARDIZEM) infusion Stopped (11/30/11 0600)    Assessment:  78 yo female with uterine cancer, presents with new onset Atrial Fibrillation w RVR, started on diltiazem drip 5/22, changed to po 5/23.  H/H low, Platelets low s/p chemo on 5/15  Heparin level therapeutic on 950 units/hr  No bleeding/complications reported  Plans noted to potentially start warfarin   Goal of Therapy:  Heparin level 0.3-0.7 units/ml Monitor platelets by anticoagulation protocol: Yes   Plan:   Continue heparin to 950 units/hr  F/U am heparin level, CBC  Consider changing to Lovenox monotherapy (without warfarin) due to active cancer.   Loralee Pacas, PharmD, BCPS Pager: [phone] 12/01/2011,7:52 AM

## 2011-12-01 NOTE — Progress Notes (Signed)
Pt rec'd from [facility] via [facility] to [facility] for TEE. Macy Mis RN

## 2011-12-01 NOTE — OR Nursing (Signed)
MD wants pt cardioverted before returning to [facility], so called delayed [facility] dispatch for now

## 2011-12-01 NOTE — Consult Note (Signed)
Subjective:  Here for TEE for RA mass evaluation.  Objective:  Vital Signs in the last 24 hours: Temp:  [97.4 F (36.3 C)-97.8 F (36.6 C)] 97.7 F (36.5 C) (05/24 1021) Pulse Rate:  [53-68] 65  (05/24 0634) Cardiac Rhythm:  [-] Atrial fibrillation (05/23 2030) Resp:  [19-20] 20  (05/24 1035) BP: (98-128)/(57-68) 122/62 mmHg (05/24 1035) SpO2:  [92 %-100 %] 100 % (05/24 1035) Weight:  [66.2 kg (145 lb 15.1 oz)] 66.2 kg (145 lb 15.1 oz) (05/24 1610)  Physical Exam: BP Readings from Last 1 Encounters:  12/01/11 122/62    Wt Readings from Last 1 Encounters:  12/01/11 66.2 kg (145 lb 15.1 oz)    Weight change: 0.3 kg (10.6 oz)  HEENT: [place]/AT, Eyes-Blue, PERL, EOMI, Conjunctiva-Pale pink, Sclera-Non-icteric Neck: No JVD, No bruit, Trachea midline. Lungs:  Clear, Bilateral. Cardiac:  Regular rhythm, normal S1 and S2, no S3. II/VI systolic murmur. Abdomen:  Soft, non-tender. Extremities:  No edema present. No cyanosis. No clubbing. CNS: AxOx3, Cranial nerves grossly intact, moves all 4 extremities. Skin: Warm and dry.   Intake/Output from previous day: 05/23 0701 - 05/24 0700 In: 669.8 [P.O.:480; I.V.:189.8] Out: 601 [Urine:600; Stool:1]    Lab Results: BMET    Component Value Date/Time   NA 130* 12/01/2011 0530   K 4.3 12/01/2011 0530   CL 95* 12/01/2011 0530   CO2 25 12/01/2011 0530   GLUCOSE 108* 12/01/2011 0530   BUN 15 12/01/2011 0530   CREATININE 0.92 12/01/2011 0530   CALCIUM 8.9 12/01/2011 0530   GFRNONAA 55* 12/01/2011 0530   GFRAA 64* 12/01/2011 0530   CBC    Component Value Date/Time   WBC 33.5* 12/01/2011 0530   WBC 18.6* 11/29/2011 1105   RBC 3.53* 12/01/2011 0530   RBC 3.92 11/29/2011 1105   HGB 9.8* 12/01/2011 0530   HGB 11.0* 11/29/2011 1105   HCT 29.9* 12/01/2011 0530   HCT 33.3* 11/29/2011 1105   PLT 142* 12/01/2011 0530   PLT 124* 11/29/2011 1105   MCV 84.7 12/01/2011 0530   MCV 84.8 11/29/2011 1105   MCH 27.8 12/01/2011 0530   MCH 28.0 11/29/2011 1105   MCHC  32.8 12/01/2011 0530   MCHC 32.9 11/29/2011 1105   RDW 14.2 12/01/2011 0530   RDW 14.7* 11/29/2011 1105   LYMPHSABS 2.0 11/30/2011 0533   LYMPHSABS 1.7 11/29/2011 1105   MONOABS 0.4 11/30/2011 0533   MONOABS 1.2* 11/29/2011 1105   EOSABS 0.0 11/30/2011 0533   EOSABS 0.0 11/29/2011 1105   BASOSABS 0.4* 11/30/2011 0533   BASOSABS 0.0 11/29/2011 1105   CARDIAC ENZYMES Lab Results  Component Value Date   CKTOTAL 47 11/30/2011   CKMB 3.2 11/30/2011   TROPONINI <0.30 11/30/2011    Assessment/Plan:  Patient Active Hospital Problem List: Atrial fibrillation with RVR (11/29/2011)   Assessment: Stable heart rate Endometrial adenocarcinoma (09/27/2011) Peripheral neuropathy (11/11/2011) Hypothyroid (11/11/2011) Breast cancer (11/11/2011) SIADH (syndrome of inappropriate ADH production) (11/11/2011) SOB (shortness of breath) (11/29/2011) Thrush (11/30/2011)  Plan: TEE today.   LOS: 2 days    Orpah Cobb  MD  12/01/2011, 10:44 AM

## 2011-12-05 ENCOUNTER — Ambulatory Visit (HOSPITAL_BASED_OUTPATIENT_CLINIC_OR_DEPARTMENT_OTHER): Payer: Medicare Other | Admitting: Oncology

## 2011-12-05 ENCOUNTER — Other Ambulatory Visit: Payer: Self-pay

## 2011-12-05 ENCOUNTER — Ambulatory Visit: Payer: Medicare Other | Admitting: Oncology

## 2011-12-05 ENCOUNTER — Ambulatory Visit (HOSPITAL_BASED_OUTPATIENT_CLINIC_OR_DEPARTMENT_OTHER): Payer: Medicare Other | Admitting: Lab

## 2011-12-05 ENCOUNTER — Other Ambulatory Visit: Payer: Medicare Other | Admitting: Lab

## 2011-12-05 ENCOUNTER — Telehealth: Payer: Self-pay | Admitting: Oncology

## 2011-12-05 ENCOUNTER — Encounter (HOSPITAL_COMMUNITY): Payer: Self-pay | Admitting: Cardiovascular Disease

## 2011-12-05 VITALS — BP 134/72 | HR 75 | Temp 97.1°F | Ht 63.0 in | Wt 143.2 lb

## 2011-12-05 DIAGNOSIS — C55 Malignant neoplasm of uterus, part unspecified: Secondary | ICD-10-CM

## 2011-12-05 DIAGNOSIS — C541 Malignant neoplasm of endometrium: Secondary | ICD-10-CM

## 2011-12-05 DIAGNOSIS — Z853 Personal history of malignant neoplasm of breast: Secondary | ICD-10-CM

## 2011-12-05 DIAGNOSIS — C50919 Malignant neoplasm of unspecified site of unspecified female breast: Secondary | ICD-10-CM

## 2011-12-05 DIAGNOSIS — M810 Age-related osteoporosis without current pathological fracture: Secondary | ICD-10-CM

## 2011-12-05 DIAGNOSIS — C549 Malignant neoplasm of corpus uteri, unspecified: Secondary | ICD-10-CM

## 2011-12-05 DIAGNOSIS — I4891 Unspecified atrial fibrillation: Secondary | ICD-10-CM

## 2011-12-05 LAB — CBC WITH DIFFERENTIAL/PLATELET
BASO%: 0.9 % (ref 0.0–2.0)
Basophils Absolute: 0.1 10*3/uL (ref 0.0–0.1)
EOS%: 0.1 % (ref 0.0–7.0)
HCT: 35.1 % (ref 34.8–46.6)
HGB: 11.6 g/dL (ref 11.6–15.9)
LYMPH%: 16.7 % (ref 14.0–49.7)
MCH: 28.5 pg (ref 25.1–34.0)
MCHC: 33.2 g/dL (ref 31.5–36.0)
MCV: 85.8 fL (ref 79.5–101.0)
MONO%: 6.8 % (ref 0.0–14.0)
NEUT%: 75.5 % (ref 38.4–76.8)
Platelets: 156 10*3/uL (ref 145–400)

## 2011-12-05 LAB — COMPREHENSIVE METABOLIC PANEL
ALT: 18 U/L (ref 0–35)
AST: 18 U/L (ref 0–37)
Alkaline Phosphatase: 97 U/L (ref 39–117)
Calcium: 8.9 mg/dL (ref 8.4–10.5)
Chloride: 100 mEq/L (ref 96–112)
Creatinine, Ser: 0.95 mg/dL (ref 0.50–1.10)

## 2011-12-05 MED ORDER — FLUCONAZOLE 100 MG PO TABS: ORAL_TABLET | ORAL | Status: DC

## 2011-12-05 MED ORDER — CEPHALEXIN 500 MG PO CAPS: ORAL_CAPSULE | ORAL | Status: DC

## 2011-12-05 MED ORDER — DEXAMETHASONE 4 MG PO TABS: ORAL_TABLET | ORAL | Status: DC

## 2011-12-05 NOTE — Telephone Encounter (Signed)
appts made and printed for pt and pt aware to see  erica

## 2011-12-05 NOTE — Telephone Encounter (Signed)
Pt was here today, appt was supposed to be tomorrow, based on documentation pt was left a VM advising pt to get calendar, talked to Dr. Darrold Span, she wants to see pt today, appt moved for today.

## 2011-12-05 NOTE — Patient Instructions (Signed)
Keflex (cephalexin) 500 mg every 12 hrs for skin area on arm. Call if does not improve.  Decadron (dexamethasone) 4 mg    Two tablets = 8 mg twice daily with food for 3 days, beginning day before chemotherapy (start in AM June 4 before chemo June 5).  We will call in another prescription of diflucan (fluconazole) just to have on hand if you get thrush again   Speak to our financial staff Terald Sleeper) before you leave office today, about the Xarelto

## 2011-12-06 ENCOUNTER — Ambulatory Visit: Payer: Medicare Other | Admitting: Oncology

## 2011-12-06 ENCOUNTER — Encounter: Payer: Self-pay | Admitting: Oncology

## 2011-12-06 ENCOUNTER — Other Ambulatory Visit: Payer: Medicare Other | Admitting: Lab

## 2011-12-06 NOTE — Progress Notes (Signed)
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 She actually tolerated the chemotherapy fairly well other than oral candida,  however required hospitalization at [facility] from 5-22 thru 12-01-11 with atrial fibrillation with RVR. She was evaluated with CT angio chest which was negative for PE but showed cardiomegaly and evidence of pulmonary hypertension, echocardiogram with normal EF but question of mass in right atrium, and TEE with no clot but probable calcification on wall. Cardioversion was attempted but was not successful. Rate was controlled with diltiazem. Anticoagulation was recommended by cardiology and she was begun on Xarelto. She has follow up with cardiologist Dr.Banajree on 12-06-11; I have given her printed copies of hospital information to take to that appointment in case he has not received this.  Patient has had some fatigue but not as significant as with the Afib. She has not seen bleeding. She is eating some and drinking fluids, which we have discussed. Bowels are moving. She has no new or different pain, no cough, no LE swelling or other edema. She has had no fever. The oral candida is improved. She has a puritic area with some redness below site of venous access RUE. LUE was used for IV access also in hospital, as she had NO left axillary LN removed and NO radiation on that side. Patient believes that her insurance does not cover Xarelto. Assistance information to be given to her today from our financial counselor (note anticoagulation is per cardiology). Remainder of 10 point Review of Systems negative/ unchanged. Objective:  Vital signs in last 24 hours:  BP 134/72  Pulse  75  Temp(Src) 97.1 F (36.2 C) (Oral)  Ht 5\' 3"  (1.6 m)  Wt 143 lb 3.2 oz (64.955 kg)  BMI 25.37 kg/m2 Weight is down 2 lbs. Ambulatory without assistance, looks brighter overall than when I had seen her last. Respirations not labored RA. Granddaughter very supportive.  HEENT:mucous membranes moist, pharynx normal without lesions LymphaticsCervical, supraclavicular, and axillary nodes normal. Resp: clear to auscultation bilaterally and normal percussion bilaterally Cardio: irregularly irregular rhythm GI: soft, non-tender; bowel sounds normal; no masses,  no organomegaly Extremities: extremities normal, atraumatic, no cyanosis or edema Correction: area of resolving ecchymosis right ventral forearm, with area of erythema below ~ 2 x 4 cm, no cord palpable. Neuro:speech fluent, CN intact, no focal motor or new sensory findings.   Lab Results:   Basename 12/05/11 1216  WBC 10.1  HGB 11.6  HCT 35.1  PLT 156   ANC 7/6 Last CBC in hospital 12-01-11 had WBC 33.5 and hgb 9.8 BMET  Basename 12/05/11 1216  NA 133*  K 4.3  CL 100  CO2 23  GLUCOSE 117*  BUN 11  CREATININE 0.95  CALCIUM 8.9   Remainder of full CMET WNL Studies/Results:  No results found.  Medications: I have reviewed the patient's current medications. I have given her written and oral instructions for the premed decadron 8 mg bid with food x 3 days beginning day prior to taxotere (begin AM June 4 prior to chemotherapy June 5). She will have another prescription for diflucan on hand if she has recurrent oral candida related to steroids. We will begin keflex 500 mg q 12 hrs for right forearm. She is instructed to call if this is not progressively  better and is also to be sure cardiology MD looks at area at visit there tomorrow if concerns.  Assessment/Plan:  1. IIIC carcinosarcoma uterus: due cycle 2 taxotere/carboplatin on 6-5 with neulasta 12-14-11. I will see her back at least 12-20-11 or sooner if needed. 2.new  onset atrial fibrillation with RVR : rate controlled on diltiazem, symptoms improved, on xarelto per cardiology, for follow up with cardiologist in [place] area tomorrow. 3.oral candida related to steroids required around taxotere, resolved 4.history of T1Nx left breast cancer 2002 treated with mastectomy only per records from outside hospital. No known recurrence. OK to use LUE for IV access 5.irritated area right forearm: Keflex as above x 1 week 6.peripheral neuropathy preceding chemotherapy such that taxotere is being used instead of taxol 7. Hypothyroidism on replacement 8.osteoporosis 9. Low sodium during several recent hospitalizations 10. L4-5 disc disease  Deztinee Lohmeyer P, MD   12/06/2011, 8:25 AM

## 2011-12-11 ENCOUNTER — Telehealth: Payer: Self-pay

## 2011-12-11 NOTE — Telephone Encounter (Signed)
Sent fax, phone and address information to Tiffany in HIM to enter information into [facility] so Dr. Darrold Span can route Future notes to Dr. Ardean Larsen.

## 2011-12-12 ENCOUNTER — Other Ambulatory Visit (HOSPITAL_COMMUNITY): Payer: Self-pay | Admitting: Oncology

## 2011-12-13 ENCOUNTER — Ambulatory Visit (HOSPITAL_BASED_OUTPATIENT_CLINIC_OR_DEPARTMENT_OTHER): Payer: Medicare Other

## 2011-12-13 ENCOUNTER — Other Ambulatory Visit (HOSPITAL_BASED_OUTPATIENT_CLINIC_OR_DEPARTMENT_OTHER): Payer: Medicare Other | Admitting: Lab

## 2011-12-13 ENCOUNTER — Encounter: Payer: Self-pay | Admitting: Oncology

## 2011-12-13 VITALS — BP 142/68 | HR 73 | Temp 97.4°F

## 2011-12-13 DIAGNOSIS — C541 Malignant neoplasm of endometrium: Secondary | ICD-10-CM

## 2011-12-13 DIAGNOSIS — C549 Malignant neoplasm of corpus uteri, unspecified: Secondary | ICD-10-CM

## 2011-12-13 DIAGNOSIS — Z5111 Encounter for antineoplastic chemotherapy: Secondary | ICD-10-CM

## 2011-12-13 LAB — CBC WITH DIFFERENTIAL/PLATELET
BASO%: 0.1 % (ref 0.0–2.0)
HCT: 28.9 % — ABNORMAL LOW (ref 34.8–46.6)
LYMPH%: 5.2 % — ABNORMAL LOW (ref 14.0–49.7)
MCHC: 33.2 g/dL (ref 31.5–36.0)
MCV: 83.5 fL (ref 79.5–101.0)
MONO#: 0.4 10*3/uL (ref 0.1–0.9)
MONO%: 5.3 % (ref 0.0–14.0)
NEUT%: 89.4 % — ABNORMAL HIGH (ref 38.4–76.8)
Platelets: 340 10*3/uL (ref 145–400)
RBC: 3.46 10*6/uL — ABNORMAL LOW (ref 3.70–5.45)
nRBC: 0 % (ref 0–0)

## 2011-12-13 LAB — BASIC METABOLIC PANEL
BUN: 22 mg/dL (ref 6–23)
CO2: 19 mEq/L (ref 19–32)
Calcium: 9.7 mg/dL (ref 8.4–10.5)
Chloride: 96 mEq/L (ref 96–112)
Creatinine, Ser: 0.88 mg/dL (ref 0.50–1.10)

## 2011-12-13 MED ORDER — SODIUM CHLORIDE 0.9 % IV SOLN
290.8000 mg | Freq: Once | INTRAVENOUS | Status: AC
  Administered 2011-12-13: 290 mg via INTRAVENOUS
  Filled 2011-12-13: qty 29

## 2011-12-13 MED ORDER — DOCETAXEL CHEMO INJECTION 160 MG/16ML
60.0000 mg/m2 | Freq: Once | INTRAVENOUS | Status: AC
  Administered 2011-12-13: 100 mg via INTRAVENOUS
  Filled 2011-12-13: qty 10

## 2011-12-13 MED ORDER — DEXAMETHASONE SODIUM PHOSPHATE 4 MG/ML IJ SOLN
20.0000 mg | Freq: Once | INTRAMUSCULAR | Status: AC
  Administered 2011-12-13: 20 mg via INTRAVENOUS

## 2011-12-13 MED ORDER — ONDANSETRON 16 MG/50ML IVPB (CHCC)
16.0000 mg | Freq: Once | INTRAVENOUS | Status: AC
  Administered 2011-12-13: 16 mg via INTRAVENOUS

## 2011-12-13 MED ORDER — SODIUM CHLORIDE 0.9 % IV SOLN
Freq: Once | INTRAVENOUS | Status: AC
  Administered 2011-12-13: 13:00:00 via INTRAVENOUS

## 2011-12-13 NOTE — Patient Instructions (Signed)
[facility] Discharge Instructions for Patients Receiving Chemotherapy  Today you received the following chemotherapy agents Taxotere and Carboplatin  To help prevent nausea and vomiting after your treatment, we encourage you to take your nausea medication as prescribed.   If you develop nausea and vomiting that is not controlled by your nausea medication, call the clinic. If it is after clinic hours your family physician or the after hours number for the clinic or go to the Emergency Department.   BELOW ARE SYMPTOMS THAT SHOULD BE REPORTED IMMEDIATELY:  *FEVER GREATER THAN 100.5 F  *CHILLS WITH OR WITHOUT FEVER  NAUSEA AND VOMITING THAT IS NOT CONTROLLED WITH YOUR NAUSEA MEDICATION  *UNUSUAL SHORTNESS OF BREATH  *UNUSUAL BRUISING OR BLEEDING  TENDERNESS IN MOUTH AND THROAT WITH OR WITHOUT PRESENCE OF ULCERS  *URINARY PROBLEMS  *BOWEL PROBLEMS  UNUSUAL RASH Items with * indicate a potential emergency and should be followed up as soon as possible.  One of the nurses will contact you 24 hours after your treatment. Please let the nurse know about any problems that you may have experienced. Feel free to call the clinic you have any questions or concerns. The clinic phone number is [phone].   I have been informed and understand all the instructions given to me. I know to contact the clinic, my physician, or go to the Emergency Department if any problems should occur. I do not have any questions at this time, but understand that I may call the clinic during office hours   should I have any questions or need assistance in obtaining follow up care.    __________________________________________  _____________  __________ Signature of Patient or Authorized Representative            Date                   Time    __________________________________________ Nurse's Signature

## 2011-12-13 NOTE — Progress Notes (Signed)
Patient son came by today to talk about getting some assistance with purchasing drug for patient, they had to pay 250.00 for last drugs. I explained to him that I have already started the process with [employer]ural Benes and [employer]. Form was submitted.

## 2011-12-14 ENCOUNTER — Telehealth: Payer: Self-pay | Admitting: *Deleted

## 2011-12-14 ENCOUNTER — Ambulatory Visit (HOSPITAL_BASED_OUTPATIENT_CLINIC_OR_DEPARTMENT_OTHER): Payer: Medicare Other

## 2011-12-14 VITALS — BP 130/71 | HR 76 | Temp 97.1°F

## 2011-12-14 DIAGNOSIS — C55 Malignant neoplasm of uterus, part unspecified: Secondary | ICD-10-CM

## 2011-12-14 DIAGNOSIS — C541 Malignant neoplasm of endometrium: Secondary | ICD-10-CM

## 2011-12-14 DIAGNOSIS — Z5189 Encounter for other specified aftercare: Secondary | ICD-10-CM

## 2011-12-14 MED ORDER — PEGFILGRASTIM INJECTION 6 MG/0.6ML
6.0000 mg | Freq: Once | SUBCUTANEOUS | Status: AC
  Administered 2011-12-14: 6 mg via SUBCUTANEOUS
  Filled 2011-12-14: qty 0.6

## 2011-12-14 NOTE — Telephone Encounter (Signed)
Caryn Bee left a message that he has questions about nausea meds and claritin. [phone] returned call, left message to call back.

## 2011-12-20 ENCOUNTER — Ambulatory Visit (HOSPITAL_BASED_OUTPATIENT_CLINIC_OR_DEPARTMENT_OTHER): Payer: Medicare Other | Admitting: Oncology

## 2011-12-20 ENCOUNTER — Telehealth: Payer: Self-pay | Admitting: Oncology

## 2011-12-20 ENCOUNTER — Encounter: Payer: Self-pay | Admitting: Oncology

## 2011-12-20 ENCOUNTER — Other Ambulatory Visit (HOSPITAL_BASED_OUTPATIENT_CLINIC_OR_DEPARTMENT_OTHER): Payer: Medicare Other | Admitting: Lab

## 2011-12-20 VITALS — BP 127/72 | HR 86 | Temp 97.0°F | Ht 63.0 in | Wt 145.9 lb

## 2011-12-20 DIAGNOSIS — G609 Hereditary and idiopathic neuropathy, unspecified: Secondary | ICD-10-CM

## 2011-12-20 DIAGNOSIS — C549 Malignant neoplasm of corpus uteri, unspecified: Secondary | ICD-10-CM

## 2011-12-20 DIAGNOSIS — C541 Malignant neoplasm of endometrium: Secondary | ICD-10-CM

## 2011-12-20 DIAGNOSIS — I4891 Unspecified atrial fibrillation: Secondary | ICD-10-CM

## 2011-12-20 DIAGNOSIS — Z8501 Personal history of malignant neoplasm of esophagus: Secondary | ICD-10-CM

## 2011-12-20 LAB — CBC WITH DIFFERENTIAL/PLATELET
BASO%: 0.1 % (ref 0.0–2.0)
Basophils Absolute: 0 10*3/uL (ref 0.0–0.1)
EOS%: 0.2 % (ref 0.0–7.0)
MCH: 27.5 pg (ref 25.1–34.0)
MCHC: 32.8 g/dL (ref 31.5–36.0)
MCV: 84 fL (ref 79.5–101.0)
MONO%: 11.1 % (ref 0.0–14.0)
RBC: 3.73 10*6/uL (ref 3.70–5.45)
RDW: 16.2 % — ABNORMAL HIGH (ref 11.2–14.5)
lymph#: 1.2 10*3/uL (ref 0.9–3.3)

## 2011-12-20 LAB — COMPREHENSIVE METABOLIC PANEL
ALT: 13 U/L (ref 0–35)
AST: 17 U/L (ref 0–37)
Albumin: 3.7 g/dL (ref 3.5–5.2)
Alkaline Phosphatase: 82 U/L (ref 39–117)
BUN: 11 mg/dL (ref 6–23)
Calcium: 9.3 mg/dL (ref 8.4–10.5)
Chloride: 95 mEq/L — ABNORMAL LOW (ref 96–112)
Creatinine, Ser: 0.95 mg/dL (ref 0.50–1.10)
Potassium: 3.8 mEq/L (ref 3.5–5.3)

## 2011-12-20 NOTE — Telephone Encounter (Signed)
Pt aware to see Dr. Roselind Messier @ [facility], pt has the appt date and time.

## 2011-12-20 NOTE — Progress Notes (Signed)
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 Recent course has been further complicated by atrial fibrillation, now under care of cardiologist Dr Nedra Hai in [place] area.  History is of vaginal spotting beginning Dec 2012, with gyn evaluation delayed because of orthopedic back problems then. She saw gynecologist in Feb 2013, with endometrial biopsy revealing endometrial cancer, tho specimen was scant. She was seen in consultation by Dr.Paola Duard Brady 09-27-2011 and went to surgery by Dr.Soper at [facility] 10-06-2011, which was robotic hysterectomy/BSO/ bilateral pelvic lymphadenectomy (periaortic nodes not visualized). [facility] [id number]) from 10-06-11 showed carcinosarcoma, heterologous type with rhabdomyosarcomatous differentiation, FIGO grade 3, near transmural invasion in anterior and posterior lower uterine segment (less than 1 mm from serosal surface). 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 She saw Dr Kyla Balzarine in follow up 10-23-2011 and is scheduled back to him July 22. With high risk for recurrence in upper abdomen and for distant recurrence, Dr Kyla Balzarine has recommended taxane + carboplatin given in sandwich fashion with RT. Taxotere was chosen in preference to taxol due to preexisting peripheral neuropathy. She has had neulasta as is standard with taxotere. She was hospitalized in [place] after cycle 1 chemotherapy with symptomatic Afib with RVR, symptomatic with weakness and shortness of breath then. She was started on Xarelto by hospitalist service.  Patient has felt more  weak and short of breath for past ~ 5 days, has had aching in legs and back of head since neulasta injection, and again had oral candida after steroids used with taxotere. She has had no chest pain, cough or fever. The shortness of breath is not necessarily with activity. Note she had negative CT angio chest during recent hospitalization. She saw Dr Nedra Hai on 12-18-11, reportedly still in Afib with rapid heart rate and good O2 sat at his office, cardiazem increased from 180 mg daily to bid then.  She is to see Dr Nedra Hai again  12-29-11. Son understood Dr Nedra Hai To say that ASA would be essentially as helpful as present  Xarelto, either of these just minimally more beneficial statistically than observation. She has had no bleeding, did have to pay full cost of the Xarelto,With platelets 91k from recent chemo, I have told her to stop Xarelto and use 81 mg ASA for now. She has been on diflucan x 2 days with improvement in mouth today. She has eaten this am and is drinking fluids. Tylenol is somewhat helpful with neulasta aches. She has had no fever or symptoms of infection. She was vry constipated again after chemo, bowels finally moved with enema, not clear how she used senakotS/ at some point took "3 other laxatives". She had several loose stools after bowels moved. I have suggested she use senakotS day before or day of chemo then daily - bid over next several days.   On Review of Systems otherwise: some swelling in LE earlier this week, now resolved. No excessive lacrimation. Is able to sleep. Feels generally tired. Remainder of 10 point ROS negative/ unchanged.  Peripheral IV access more difficult. She does not want PAC if it can be avoided, tho son feels this would be helpful.  Objective:  Vital  signs in last 24 hours:  BP 127/72  Pulse 86  Temp 97 F (36.1 C) (Oral)  Ht 5\' 3"  (1.6 m)  Wt 145 lb 14.4 oz (66.18 kg)  BMI 25.85 kg/m2 This weight is up 2.5 lbs. Awake and alert, looks tired but NAD, refused  wheelchair. Sons very supportive.Somewhat pale.  HEENT:mucous membranes moist, pharynx normal without lesions, slightly pale, minimal coating on tongue only. PERRL. No JVD, neck supple. LymphaticsCervical, supraclavicular, and axillary nodes normal. Resp: clear to auscultation bilaterally. Respirations not labored RA. Cardio: irregularly irregular rhythm, clear heart sounds GI: soft, non-tender; bowel sounds normal; no masses,  no organomegalyNot distended. Extremities: resolving ecchymoses at IV site.No edema, cords, tenderness. Neuro:stable peripheral neuropathy   Lab Results:   Basename 12/20/11 1131  WBC 13.9*  HGB 10.3*  HCT 31.3*  PLT 91*  ANC 11.1 post neulasta  BMET  Basename 12/20/11 1131  NA 131*  K 3.8  CL 95*  CO2 26  GLUCOSE 151*  BUN 11  CREATININE 0.95  CALCIUM 9.3  Tprot 5.5, alb 3.7, remainder of CMET WNL  Studies/Results:  No results found.  Medications: I have reviewed the patient's current medications. Hold Xarelto; begin baby ASA. If Dr Nedra Hai agrees, I would continue ASA in favor of Xarelto at least thru chemotherapy.  Assessment/Plan: 1.IIIC carcinosarcoma of uterus: counts not yet at nadir from cycle 2 carboplatin/taxotere with neulasta, in this 78 yo lady. I have asked her to get CBC by PCP early next week and let me know results. I have made appointment for new patient consultation with Dr Roselind Messier in [place] clinic on 12-28-11. I will see her back at least 01-02-12 and decide then if she is able to have cycle 3 carbo/taxotere on schedule 01-03-12. If counts are ok but situation otherwise still problematic, could consider carboplatin only for that treatment (I did not discuss [place] only with them today; I did tell her not to start premed decadron 6-25 am prior to visit). Dr Kyla Balzarine recommended RT after 3 cycles chemotherapy; she may need repear imaging prior to RT (? If this was at [facility] previously, as none in [place] system). 2.atrial fibrillation with RVR, failed  attempt at cardioversion. Cardiazem increased this week. Discussion Xarelto above. 3.peripheral neuropathy preceding chemo 4.somewhat difficult peripheral IV access 5.left breast cancer remotely, post mastectomy with no axillary nodes sampled, not known recurrent 6.oral candida improving on diflucan   Patient and family appreciated lengthy discussion today and were in agreement with plan. They will call prior to scheduled visit if needed.   Marshell Rieger P, MD   12/20/2011, 9:09 PM

## 2011-12-20 NOTE — Patient Instructions (Signed)
Have CBC checked by primary MD next week ~ Tuesday 6-18 and have them let Dr Darrold Span know results  Stop xarelto now with platelets a little low. Take 1 baby ASA daily for now

## 2011-12-20 NOTE — Telephone Encounter (Signed)
Gave pt appt calendar June and July 2013 lab, chemo and MD

## 2011-12-21 ENCOUNTER — Telehealth: Payer: Self-pay | Admitting: Medical Oncology

## 2011-12-21 NOTE — Telephone Encounter (Signed)
Unable to contact pt x 2 attempts by phone

## 2011-12-22 ENCOUNTER — Telehealth: Payer: Self-pay

## 2011-12-22 ENCOUNTER — Telehealth: Payer: Self-pay | Admitting: Oncology

## 2011-12-22 ENCOUNTER — Encounter: Payer: Self-pay | Admitting: Oncology

## 2011-12-22 ENCOUNTER — Other Ambulatory Visit: Payer: Self-pay | Admitting: Oncology

## 2011-12-22 NOTE — Telephone Encounter (Signed)
FAXED OFFICE NOTE 12-21-11 TO DR. Hannah Beat) AS REQUESTED BY DR. Darrold Span.

## 2011-12-22 NOTE — Progress Notes (Signed)
email to RT staff + Dr Roselind Messier re records to [facility] for new patient appointment there. RT staff has contacted Tiffany in [place] to have records faxed.  L.Dollie Mayse

## 2011-12-22 NOTE — Progress Notes (Signed)
email to gyn onc office/ cc Dr Roselind Messier  ? Appointment back to Dr Kyla Balzarine at [facility] & ? rescan [facility] or elsewhere after #3 chemo/ before RT.  L.Andree Golphin MD

## 2011-12-22 NOTE — Telephone Encounter (Signed)
Spoke with son Caryn Bee and told him that Dr. Darrold Span said that the sodium level on 12-20-11 was not bad at 131. Ms. Haglund will have cbc drawn at her PCP Dr. Leary Roca" office in [place]. O: [phone] on 12-25-11.  The results will be available on 12-26-11. Changed the home phone number for Ms. Sochacki to son's number as she is not able to handle phone calls.

## 2011-12-22 NOTE — Telephone Encounter (Signed)
Faxed pt. Medical records to [facility].

## 2011-12-27 ENCOUNTER — Telehealth: Payer: Self-pay | Admitting: Oncology

## 2011-12-27 NOTE — Telephone Encounter (Signed)
Son called to see if I had received labs from PCP done yesterday in [place], which I have not. He will call PCP office tomorrow as after 5 pm now. L.Jessicaann Overbaugh MD

## 2011-12-28 ENCOUNTER — Other Ambulatory Visit: Payer: Self-pay | Admitting: Oncology

## 2011-12-28 ENCOUNTER — Telehealth: Payer: Self-pay | Admitting: *Deleted

## 2011-12-28 NOTE — Telephone Encounter (Signed)
Called Dr. Leary Roca at [phone] for lab results drawn on 12-25-2011.  Results faxed to triage.

## 2011-12-29 ENCOUNTER — Telehealth: Payer: Self-pay

## 2011-12-29 NOTE — Telephone Encounter (Signed)
Spoke with son Caryn Bee and told him that his mother"s cbc good.  Hgb 10.1.  Plt 116k Pt. Just left cardiologist and he felt that the sob with rest and exertion is from the chemotherapy. Suggested that son have the cardiologist fax a note to Dr. Darrold Span by the end of the day 01-01-12 so she can have that evaluation for her visit 01-02-12.  Son verbalized that he will request information be faxed.

## 2012-01-02 ENCOUNTER — Telehealth: Payer: Self-pay | Admitting: Oncology

## 2012-01-02 ENCOUNTER — Other Ambulatory Visit (HOSPITAL_BASED_OUTPATIENT_CLINIC_OR_DEPARTMENT_OTHER): Payer: Medicare Other | Admitting: Lab

## 2012-01-02 ENCOUNTER — Ambulatory Visit (HOSPITAL_BASED_OUTPATIENT_CLINIC_OR_DEPARTMENT_OTHER): Payer: Medicare Other | Admitting: Oncology

## 2012-01-02 ENCOUNTER — Encounter: Payer: Self-pay | Admitting: Oncology

## 2012-01-02 VITALS — BP 143/75 | HR 73 | Temp 97.4°F | Ht 63.0 in | Wt 148.3 lb

## 2012-01-02 DIAGNOSIS — C549 Malignant neoplasm of corpus uteri, unspecified: Secondary | ICD-10-CM

## 2012-01-02 DIAGNOSIS — C541 Malignant neoplasm of endometrium: Secondary | ICD-10-CM

## 2012-01-02 DIAGNOSIS — K649 Unspecified hemorrhoids: Secondary | ICD-10-CM

## 2012-01-02 DIAGNOSIS — I4891 Unspecified atrial fibrillation: Secondary | ICD-10-CM

## 2012-01-02 LAB — CBC WITH DIFFERENTIAL/PLATELET
BASO%: 0.3 % (ref 0.0–2.0)
Basophils Absolute: 0 10*3/uL (ref 0.0–0.1)
EOS%: 0.5 % (ref 0.0–7.0)
HCT: 31 % — ABNORMAL LOW (ref 34.8–46.6)
HGB: 10.4 g/dL — ABNORMAL LOW (ref 11.6–15.9)
MCH: 28.2 pg (ref 25.1–34.0)
MCHC: 33.5 g/dL (ref 31.5–36.0)
MCV: 84 fL (ref 79.5–101.0)
MONO%: 15.4 % — ABNORMAL HIGH (ref 0.0–14.0)
NEUT%: 66.2 % (ref 38.4–76.8)

## 2012-01-02 LAB — COMPREHENSIVE METABOLIC PANEL
Albumin: 4.4 g/dL (ref 3.5–5.2)
BUN: 16 mg/dL (ref 6–23)
Calcium: 9.3 mg/dL (ref 8.4–10.5)
Chloride: 97 mEq/L (ref 96–112)
Glucose, Bld: 96 mg/dL (ref 70–99)
Potassium: 4.1 mEq/L (ref 3.5–5.3)

## 2012-01-02 NOTE — Patient Instructions (Signed)
No taxotere with chemo on 01-03-12. You will take the neulasta shot on 6-27

## 2012-01-02 NOTE — Telephone Encounter (Signed)
gv pt appt schedule for June/July including ct for 7/18.

## 2012-01-02 NOTE — Progress Notes (Signed)
OFFICE PROGRESS NOTE Date of Visit 01-02-12 Physicians: J.Soper, J.Kinard, M.Mahoney (PCP, [name]), Phillip Heal (cardiology, [name])  INTERVAL HISTORY:  Patient is seen, together with son, in continuing attention to her uterine carcinosarcoma, due third cycle of adjuvant chemotherapy on 01-03-12.  History is of vaginal spotting beginning Dec 2012, with gyn evaluation delayed because of orthopedic back problems then. She saw gynecologist in Feb 2013, with endometrial biopsy revealing endometrial cancer, tho specimen was scant. She was seen in consultation by Dr.Paola Duard Brady 09-27-2011 and went to surgery by Dr.Soper at [facility] 10-06-2011, which was robotic hysterectomy/BSO/ bilateral pelvic lymphadenectomy (periaortic nodes not visualized). [facility] [id number]) from 10-06-11 showed carcinosarcoma, heterologous type with rhabdomyosarcomatous differentiation, FIGO grade 3, near transmural invasion in anterior and posterior lower uterine segment (less than 1 mm from serosal surface). 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 She saw Dr Kyla Balzarine in follow up 10-23-2011 and is scheduled back to him July 22. With high risk for recurrence in upper abdomen and for distant recurrence, Dr Kyla Balzarine has recommended taxane + carboplatin given in sandwich fashion with RT. Taxotere was chosen in preference to taxol due to preexisting peripheral neuropathy. She has had neulasta as is standard with taxotere. She was hospitalized in [place] after cycle 1 chemotherapy with symptomatic Afib with RVR, symptomatic with weakness and shortness of breath then. Attempts at cardioversion were not successful and Dr. Nedra Hai has decreased cardiazem since she was here last, which seems to have helped symptoms somewhat. She was initially on xarelto, changed to ASA particularly  due to thrombocytopenia with chemotherapy. She had second cycle of taxotere/carboplatin on 12-13-11, with neulasta on 12-14-11, nadir platelets 90k.  Patient saw Dr Roselind Messier in consultation and is to have RT in [place] after this third chemotherapy treatment. Dr Roselind Messier is away this week, however the [facility] office tells me that patient is not set up for CT at [facility], tho she is for simulation there on July 11. I have also been in touch with Dr Kyla Balzarine, confirming that she did not have CTs done at [facility] prior to surgery, but he agrees that these would be appropriate prior to RT.Patient and son prefer gyn oncology follow up in [place], so I have made an appointment with Dr Nelly Rout 01-25-12 and will request CT AP prior to visit same day.  Patient seems to be doing some better, tho she tells me that she is still feeling badly, particularly with shortness of breath on exertion and fatigue, and uncomfortable hemorrhoids. She is having no nausea now, no fever or symptoms of infection, no cough, no chest pain. She has had some pedal edema, better with elevation and decreased sodium intake.Bowels are moving more regularly; she is using preparation H for the hemorrhoids. She voids frequently at night, long-standing. She has had no bleeding.  Objective:  Vital signs in last 24 hours:  BP 143/75  Pulse 73  Temp 97.4 F (36.3 C) (Oral)  Ht 5\' 3"  (1.6 m)  Wt 148 lb 4.8 oz (67.268 kg)  BMI 26.27 kg/m2 Weight is up 2 lbs. Ambulatory without assistance, respirations not labored RA  HEENT:mucous membranes moist, pharynx normal without lesions. Partial alopecia. PERRL, not icteric. Decreased vision OS as previously. No JVD LymphaticsCervical, supraclavicular, and axillary nodes normal. No inguinal adenopathy  Resp: clear to percussion, few crackles in bases bilaterally, otherwise clear. Cardio: irregularly  irregular rhythm GI: somewhat distended, few bowel sounds, not tender, no clear fluid wave, no appreciable HSM. Midline  incision healed. Extremities: trace pedal edema bilaterally without cords or tenderness Neuro:nonfocal except baseline peripheral neuropathy Perirectal area remarkable for external hemorrhoidal tags which do not appear to be thrombosed  Lab Results:   Basename 01/02/12 1401  WBC 6.4  HGB 10.4*  HCT 31.0*  PLT 321  ANC 4.2  BMET  Basename 01/02/12 1401  NA 133*  K 4.1  CL 97  CO2 26  GLUCOSE 96  BUN 16  CREATININE 1.07  CALCIUM 9.3    Studies/Results:  CT AP set up for [employer] 01-25-12. I will let Dr Roselind Messier know this, to be sure we are not duplicating scans. (previous CT angio chest was negative for PE)  Medications: I have reviewed the patient's current medications.WIll try Proctofoam HC for hemorrhoids.  We have discussed the fact that she has had a difficult time tolerating chemotherapy to this point, obviously in part due to the new and symptomatic atrial fibrillation. I have recommended that we use carboplatin alone for treatment on 01-03-12, which should be better tolerated than taxotere (+steroids). When chemotherapy resumes after RT, we can decide if taxotere + carboplatin would be appropriate to continue. She and son are in agreement.   Assessment/Plan: 1.Uterine carcinosarcoma in 78 year old lady with  recent cardiac comorbidity: will give single agent carboplatin for cycle 3, but will still give neulasta on day 2. I will see her back 01-15-12 with cbc/cmet, or sooner if needed. She will have CT AP prior to seeing Dr Nelly Rout on 01-25-12 (she will cancel appointment at [facility] with Dr Kyla Balzarine for 7-20) She will have RT in [place] as long as no contraindications based on upcoming CT, then additional chemo as above. 2.atrial fibrillation, on cardiazem and ASA 3.hemorrhoids: encourage sitz baths and try proctofoam HC.  Patient and son were in agreement with plan as above.   Maurice Ramseur P, MD   01/02/2012, 8:59 PM

## 2012-01-03 ENCOUNTER — Other Ambulatory Visit: Payer: Self-pay | Admitting: *Deleted

## 2012-01-03 ENCOUNTER — Ambulatory Visit (HOSPITAL_BASED_OUTPATIENT_CLINIC_OR_DEPARTMENT_OTHER): Payer: Medicare Other

## 2012-01-03 VITALS — BP 148/82 | HR 97 | Temp 97.6°F

## 2012-01-03 DIAGNOSIS — C541 Malignant neoplasm of endometrium: Secondary | ICD-10-CM

## 2012-01-03 DIAGNOSIS — C55 Malignant neoplasm of uterus, part unspecified: Secondary | ICD-10-CM

## 2012-01-03 DIAGNOSIS — K649 Unspecified hemorrhoids: Secondary | ICD-10-CM

## 2012-01-03 DIAGNOSIS — Z5111 Encounter for antineoplastic chemotherapy: Secondary | ICD-10-CM

## 2012-01-03 MED ORDER — HYDROCORTISONE ACE-PRAMOXINE 2.5-1 % RE CREA: TOPICAL_CREAM | Freq: Three times a day (TID) | RECTAL | Status: AC

## 2012-01-03 MED ORDER — ONDANSETRON 8 MG/50ML IVPB (CHCC)
8.0000 mg | Freq: Once | INTRAVENOUS | Status: AC
  Administered 2012-01-03: 8 mg via INTRAVENOUS

## 2012-01-03 MED ORDER — SODIUM CHLORIDE 0.9 % IV SOLN
260.0000 mg | Freq: Once | INTRAVENOUS | Status: AC
  Administered 2012-01-03: 260 mg via INTRAVENOUS
  Filled 2012-01-03: qty 26

## 2012-01-03 MED ORDER — SODIUM CHLORIDE 0.9 % IV SOLN
Freq: Once | INTRAVENOUS | Status: AC
  Administered 2012-01-03: 11:00:00 via INTRAVENOUS

## 2012-01-03 MED ORDER — DEXAMETHASONE SODIUM PHOSPHATE 4 MG/ML IJ SOLN
12.0000 mg | Freq: Once | INTRAMUSCULAR | Status: AC
  Administered 2012-01-03: 12 mg via INTRAVENOUS

## 2012-01-03 NOTE — Progress Notes (Signed)
Pt given a sitz bath from purchasing. Instructed her and grand-daughter on the use. Both verbalized understanding of use (at least 3x/day). Pt also given written instructions.

## 2012-01-03 NOTE — Patient Instructions (Addendum)
[facility] Discharge Instructions for Patients Receiving Chemotherapy  Today you received the following chemotherapy agents Carboplatin To help prevent nausea and vomiting after your treatment, we encourage you to take your nausea medication. Begin taking it at 7pm and take it as often as prescribed for the next 24-72 hours.  Please use Sitz Bath at least 3 times per day.    If you develop nausea and vomiting that is not controlled by your nausea medication, call the clinic. If it is after clinic hours your family physician or the after hours number for the clinic or go to the Emergency Department.   BELOW ARE SYMPTOMS THAT SHOULD BE REPORTED IMMEDIATELY:  *FEVER GREATER THAN 100.5 F  *CHILLS WITH OR WITHOUT FEVER  NAUSEA AND VOMITING THAT IS NOT CONTROLLED WITH YOUR NAUSEA MEDICATION  *UNUSUAL SHORTNESS OF BREATH  *UNUSUAL BRUISING OR BLEEDING  TENDERNESS IN MOUTH AND THROAT WITH OR WITHOUT PRESENCE OF ULCERS  *URINARY PROBLEMS  *BOWEL PROBLEMS  UNUSUAL RASH Items with * indicate a potential emergency and should be followed up as soon as possible.  One of the nurses will contact you 24 hours after your treatment. Please let the nurse know about any problems that you may have experienced. Feel free to call the clinic you have any questions or concerns. The clinic phone number is [phone].   I have been informed and understand all the instructions given to me. I know to contact the clinic, my physician, or go to the Emergency Department if any problems should occur. I do not have any questions at this time, but understand that I may call the clinic during office hours   should I have any questions or need assistance in obtaining follow up care.    __________________________________________  _____________  __________ Signature of Patient or Authorized Representative            Date                    Time    __________________________________________ Nurse's Signature   Sitz Bath A sitz bath is a warm water bath taken in the sitting position that covers only the hips and buttocks. It may be used for either healing or hygiene purposes. Sitz baths are also used to relieve pain, itching, or muscle spasms. The water may contain medicine. Moist heat will help you heal and relax.  HOME CARE INSTRUCTIONS   Fill the bathtub half full with warm water.   Sit in the water and open the drain a little.   Turn on the warm water to keep the tub half full. Keep the water running constantly.   Soak in the water for 15 to 20 minutes.   After the sitz bath, pat the affected area dry first.   Take 3 to 4 sitz baths a day.  SEEK MEDICAL CARE IF:  You get worse instead of better. Stop the sitz baths if you get worse. MAKE SURE YOU:  Understand these instructions.   Will watch your condition.   Will get help right away if you are not doing well or get worse.  Document Released: 03/18/2004 Document Revised: 06/15/2011 Document Reviewed: 09/23/2010 [facility] Patient Information 2012 [place], [place].

## 2012-01-04 ENCOUNTER — Encounter: Payer: Self-pay | Admitting: Oncology

## 2012-01-04 ENCOUNTER — Ambulatory Visit (HOSPITAL_BASED_OUTPATIENT_CLINIC_OR_DEPARTMENT_OTHER): Payer: Medicare Other

## 2012-01-04 VITALS — BP 126/76 | HR 83 | Temp 97.1°F

## 2012-01-04 DIAGNOSIS — C541 Malignant neoplasm of endometrium: Secondary | ICD-10-CM

## 2012-01-04 DIAGNOSIS — C55 Malignant neoplasm of uterus, part unspecified: Secondary | ICD-10-CM

## 2012-01-04 MED ORDER — PEGFILGRASTIM INJECTION 6 MG/0.6ML
6.0000 mg | Freq: Once | SUBCUTANEOUS | Status: AC
  Administered 2012-01-04: 6 mg via SUBCUTANEOUS
  Filled 2012-01-04: qty 0.6

## 2012-01-04 NOTE — Progress Notes (Unsigned)
email to Dr Roselind Messier (who is away this week) and [facility] gyn onc staff, to see if she needs CT AP in [place] after cycle 3 chemo/ before RT in [place], or if this is to be done in [place] with the RT simulation there. Note patient has iodine allergy and will need steroid premeds for contrasted CT. Had #3 chemo 01-03-12, is for RT simulation in [facility] 01-18-12 and is to see Dr Nelly Rout in [facility] 01-25-12 pm. She is at least tentatively scheduled for CT AP in [facility] 01-25-12 AM.  Ila Mcgill, MD

## 2012-01-15 ENCOUNTER — Ambulatory Visit (HOSPITAL_COMMUNITY)
Admission: RE | Admit: 2012-01-15 | Discharge: 2012-01-15 | Disposition: A | Discharge status: Home / Self Care | Payer: Medicare Other | Source: Ambulatory Visit | Attending: Oncology | Admitting: Oncology

## 2012-01-15 ENCOUNTER — Encounter: Payer: Self-pay | Admitting: Oncology

## 2012-01-15 ENCOUNTER — Other Ambulatory Visit (HOSPITAL_BASED_OUTPATIENT_CLINIC_OR_DEPARTMENT_OTHER): Payer: Medicare Other | Admitting: Lab

## 2012-01-15 ENCOUNTER — Telehealth: Payer: Self-pay

## 2012-01-15 ENCOUNTER — Telehealth: Payer: Self-pay | Admitting: Oncology

## 2012-01-15 ENCOUNTER — Ambulatory Visit (HOSPITAL_BASED_OUTPATIENT_CLINIC_OR_DEPARTMENT_OTHER): Payer: Medicare Other | Admitting: Oncology

## 2012-01-15 VITALS — BP 118/59 | HR 88 | Temp 97.7°F | Ht 63.0 in | Wt 146.8 lb

## 2012-01-15 DIAGNOSIS — C549 Malignant neoplasm of corpus uteri, unspecified: Secondary | ICD-10-CM

## 2012-01-15 DIAGNOSIS — K649 Unspecified hemorrhoids: Secondary | ICD-10-CM

## 2012-01-15 DIAGNOSIS — L84 Corns and callosities: Secondary | ICD-10-CM

## 2012-01-15 DIAGNOSIS — M7989 Other specified soft tissue disorders: Secondary | ICD-10-CM

## 2012-01-15 DIAGNOSIS — M79606 Pain in leg, unspecified: Secondary | ICD-10-CM

## 2012-01-15 DIAGNOSIS — C541 Malignant neoplasm of endometrium: Secondary | ICD-10-CM

## 2012-01-15 DIAGNOSIS — M7120 Synovial cyst of popliteal space [Baker], unspecified knee: Secondary | ICD-10-CM | POA: Insufficient documentation

## 2012-01-15 DIAGNOSIS — I4891 Unspecified atrial fibrillation: Secondary | ICD-10-CM

## 2012-01-15 LAB — CBC WITH DIFFERENTIAL/PLATELET
Basophils Absolute: 0 10*3/uL (ref 0.0–0.1)
Eosinophils Absolute: 0 10*3/uL (ref 0.0–0.5)
HGB: 10.2 g/dL — ABNORMAL LOW (ref 11.6–15.9)
MCV: 85.1 fL (ref 79.5–101.0)
MONO#: 0.6 10*3/uL (ref 0.1–0.9)
MONO%: 6.2 % (ref 0.0–14.0)
NEUT#: 7.9 10*3/uL — ABNORMAL HIGH (ref 1.5–6.5)
RDW: 19.3 % — ABNORMAL HIGH (ref 11.2–14.5)
WBC: 9.5 10*3/uL (ref 3.9–10.3)
lymph#: 1 10*3/uL (ref 0.9–3.3)

## 2012-01-15 LAB — COMPREHENSIVE METABOLIC PANEL
Albumin: 3.8 g/dL (ref 3.5–5.2)
BUN: 13 mg/dL (ref 6–23)
CO2: 23 mEq/L (ref 19–32)
Calcium: 9.1 mg/dL (ref 8.4–10.5)
Chloride: 99 mEq/L (ref 96–112)
Glucose, Bld: 148 mg/dL — ABNORMAL HIGH (ref 70–99)
Potassium: 4.1 mEq/L (ref 3.5–5.3)

## 2012-01-15 NOTE — Telephone Encounter (Signed)
Janann August stated that Ms. Kaylee Pugh' doppler was negative for DVT or Bker's cyst.  Red Christians that Ms. Kaylee Pugh can go home.

## 2012-01-15 NOTE — Progress Notes (Signed)
VASCULAR LAB PRELIMINARY  PRELIMINARY  PRELIMINARY  PRELIMINARY  Right lower extremity venous duplex completed.    Preliminary report:  Right:  No evidence of DVT or superficial thrombosis.  Small area of fluid noted in the right popliteal fossa.  Jacquees Gongora, RVT 01/15/2012, 12:19 PM

## 2012-01-15 NOTE — Telephone Encounter (Signed)
Pt sent over to [facility] for doppler and given schedule for July/August. Chemo/inj for 7/17 and 7/18 cx'd per 7/8 pof.

## 2012-01-15 NOTE — Progress Notes (Signed)
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 She is to have restaging CT AP in [facility] prior to seeing [facility] on 01-25-12, and will have RT in [facility] unless problems on that scan. If possible, we will resume chemotherapy after RT.   History is of vaginal spotting beginning Dec 2012, with gyn evaluation delayed because of orthopedic problems then. She saw gynecologist in Feb 2013, with endometrial biopsy revealing endometrial cancer, tho specimen was scant. She was seen in consultation by [facility] 09-27-2011 and had surgery by Kaylee Pugh at [facility] 10-06-2011, which was robotic hysterectomy/BSO/ bilateral pelvic lymphadenectomy (periaortic nodes not visualized). [facility] [phone]) from 10-06-11 showed carcinosarcoma, heterologous type with rhabdomyosarcomatous differentiation, FIGO grade 3, near transmural invasion in anterior and posterior lower uterine segment (less than 1 mm from serosal surface). 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 With high risk for recurrence in upper abdomen and for distant recurrence, [facility] has recommended taxane + carboplatin given in sandwich fashion with RT. Taxotere was chosen in preference to taxol due to preexisting peripheral neuropathy. She was hospitalized in [place] after cycle 1 chemotherapy with symptomatic Afib with RVR. Attempts at cardioversion were not successful during that  hospitalization. She is being followed by cardiology in [place] now. She was initially on xarelto, changed to ASA particularly due to thrombocytopenia with chemotherapy. Patient saw [facility] in consultation and will have RT in [facility] as long as upcoming CT is ok.  I have also been in touch with Kaylee P[facility], confirming that she did not have CTs done at [facility] prior to surgery. She has iodine allergy and is to have full steroid prep for CT per my conversation with radiology now.  Patient has felt perhaps not quite as poorly since carboplatin only on 01-03-12, but is still very fatigued. Bowels have moved only 3x in past week, very hard and still hemorrhoidal discomfort. She has had no nausea. She had callous on bottom of right foot trimmed by podiatrist, area painful and bleeding now with some swelling of foot and ankle. She has had no other bleeding. She denies cough, abdominal or pelvic pain other than hemorrhoids, is up multiple times every night to void (as she has done chronically, never any intervention for this). She has had no fever. She denies chest pain. She has a difficult time with even minimal activity, which is very upsetting to her. She had HA "for a week" after chemo, not clearly associated with zofran, resolved with one dose of ASA. She did not contact this office about any problems prior to today's visit. Remainder of 10 point Review of Systems negative/ unchanged.  Objective:  Vital signs in last 24 hours:  BP 118/59  Pulse 88  Temp 97.7 F (36.5 C) (Oral)  Ht 5\' 3"  (1.6 m)  Wt 146 lb 12.8 oz (66.588 kg)  BMI 26.00 kg/m2 Weight is down 1 lb. Ambulatory without assistance. Respirations a little labored just with activity in exam room. Alert and appropriate.  HEENT:mucous membranes moist, pharynx normal without lesions. Alopecia. PERRL, not icteric. Neck supple without JVD LymphaticsCervical, supraclavicular, and axillary nodes normal.No inguinal adenopathy Resp: clear to  auscultation bilaterally and normal percussion bilaterally Cardio: irregularly irregular rhythm GI: soft, non-tender; bowel sounds normal; no masses,  no organomegaly Not distended. Not tender epigastrium. External hemorrhoids less irritated and softer, no perirectal tenderness or fluctuance.  Extremities: 1+ swelling right foot and lower leg without cords, none on LLE. Superficial 1 x 2 cm open area with slight bleeding over metatarsal heads, dressing replaced. No streaking or purulent drainage, no significant erythema there Neuro: nonfocal Skin without rash, bruises, petechiae  Lab Results:   [facility] 01/15/12 0959  WBC 9.5  HGB 10.2*  HCT 31.5*  PLT 141*  ANC 7.9 post neulasta 01-04-12. This Hgb is stable over past 4 weeks. BMET  Basename 01/15/12 0959  NA 132*  K 4.1  CL 99  CO2 23  GLUCOSE 148*  BUN 13  CREATININE 0.91  CALCIUM 9.1  remainder of full CMET with AP 126, Tprot 5.8 otherwise normal  Studies/Results:  CT pending 01-25-12  Patient and daughter in law are very concerned that she might have DVT with RLE swelling, tho she does have the open wound on sole of that foot. As we are able to get venous doppler now, this has been done and was negative for clot.   Medications: I have reviewed the patient's current medications. She has been instructed to increase SenaS to 1 bid and to add colace 100 mg bid to this. She will continue proctofoam HC (and I have again encouraged sitz baths for hemorrhoids)  Assessment/Plan: 1. Uterine carcinosarcoma in 78 yo lady who has had difficult time with treatment thus far, in part related to new cardiac issues. She will have CT and see [facility] Pugh next week, then RT. I will see her mid August or sooner if needed 2.atrial fibrillation with RVR. Still seems very symptomatic and I have encouraged her to call cardiology office; I do not think all of her fatigue and weakness is chemotherapy related. 3.hemorrhoids as above 4.painful foot  post callous trimming: she is going back to podiatrist today. No DVT.  Patient and daughter in law in agreement with plan.  Reece Packer, MD   01/15/2012, 9:23 PM

## 2012-01-15 NOTE — Patient Instructions (Signed)
CT and Dr Nelly Rout as scheduled in [place]

## 2012-01-23 NOTE — Progress Notes (Signed)
Spoke with Misty Stanley in [place].  She will call pt.'s Son  To review the premeds prescribed by radiologist for her contrast allergy.  Told Misty Stanley that the son requests to be call since his mother get nervous about information given to her , especially regarding her cancer care.

## 2012-01-24 ENCOUNTER — Ambulatory Visit: Payer: Medicare Other

## 2012-01-25 ENCOUNTER — Ambulatory Visit: Payer: Medicare Other | Attending: Gynecologic Oncology | Admitting: Gynecologic Oncology

## 2012-01-25 ENCOUNTER — Encounter (HOSPITAL_COMMUNITY): Payer: Self-pay

## 2012-01-25 ENCOUNTER — Ambulatory Visit (HOSPITAL_COMMUNITY)
Admission: RE | Admit: 2012-01-25 | Discharge: 2012-01-25 | Disposition: A | Discharge status: Home / Self Care | Payer: Medicare Other | Source: Ambulatory Visit | Attending: Oncology | Admitting: Oncology

## 2012-01-25 ENCOUNTER — Encounter: Payer: Self-pay | Admitting: Gynecologic Oncology

## 2012-01-25 ENCOUNTER — Ambulatory Visit: Payer: Medicare Other

## 2012-01-25 VITALS — BP 122/80 | HR 68 | Temp 97.5°F | Resp 18 | Ht 63.0 in | Wt 151.3 lb

## 2012-01-25 DIAGNOSIS — M810 Age-related osteoporosis without current pathological fracture: Secondary | ICD-10-CM | POA: Insufficient documentation

## 2012-01-25 DIAGNOSIS — K5730 Diverticulosis of large intestine without perforation or abscess without bleeding: Secondary | ICD-10-CM | POA: Insufficient documentation

## 2012-01-25 DIAGNOSIS — C549 Malignant neoplasm of corpus uteri, unspecified: Secondary | ICD-10-CM | POA: Insufficient documentation

## 2012-01-25 DIAGNOSIS — Z90710 Acquired absence of both cervix and uterus: Secondary | ICD-10-CM | POA: Insufficient documentation

## 2012-01-25 DIAGNOSIS — C541 Malignant neoplasm of endometrium: Secondary | ICD-10-CM

## 2012-01-25 DIAGNOSIS — K8020 Calculus of gallbladder without cholecystitis without obstruction: Secondary | ICD-10-CM | POA: Insufficient documentation

## 2012-01-25 DIAGNOSIS — N269 Renal sclerosis, unspecified: Secondary | ICD-10-CM | POA: Insufficient documentation

## 2012-01-25 DIAGNOSIS — Z9010 Acquired absence of unspecified breast and nipple: Secondary | ICD-10-CM | POA: Insufficient documentation

## 2012-01-25 DIAGNOSIS — K869 Disease of pancreas, unspecified: Secondary | ICD-10-CM | POA: Insufficient documentation

## 2012-01-25 DIAGNOSIS — Z853 Personal history of malignant neoplasm of breast: Secondary | ICD-10-CM | POA: Insufficient documentation

## 2012-01-25 DIAGNOSIS — Z9221 Personal history of antineoplastic chemotherapy: Secondary | ICD-10-CM | POA: Insufficient documentation

## 2012-01-25 DIAGNOSIS — E079 Disorder of thyroid, unspecified: Secondary | ICD-10-CM | POA: Insufficient documentation

## 2012-01-25 DIAGNOSIS — K429 Umbilical hernia without obstruction or gangrene: Secondary | ICD-10-CM | POA: Insufficient documentation

## 2012-01-25 DIAGNOSIS — C55 Malignant neoplasm of uterus, part unspecified: Secondary | ICD-10-CM

## 2012-01-25 DIAGNOSIS — E785 Hyperlipidemia, unspecified: Secondary | ICD-10-CM | POA: Insufficient documentation

## 2012-01-25 DIAGNOSIS — I10 Essential (primary) hypertension: Secondary | ICD-10-CM | POA: Insufficient documentation

## 2012-01-25 MED ORDER — IOHEXOL 300 MG/ML  SOLN
100.0000 mL | Freq: Once | INTRAMUSCULAR | Status: AC | PRN
  Administered 2012-01-25: 100 mL via INTRAVENOUS

## 2012-01-25 NOTE — Patient Instructions (Addendum)
Follow up with scheduled appointments with Drs. Kinard and Darrold Span  Thank you very much Ms. Kaylee Pugh for allowing me to provide care for you today.  I appreciate your confidence in choosing our Gynecologic Oncology team.  If you have any questions about your visit today please call our office and we will get back to you as soon as possible.  Maryclare Labrador. Keygan Dumond MD., PhD Gynecologic Oncology

## 2012-01-25 NOTE — Progress Notes (Signed)
Office Visit  Note: Gyn-Onc  [facility]lee [facility] 78 y.o. female  CC:  Surveillance uterine carcinosarcoma  HPI: Patient is an 78 year old gravida 4 para 4 who began having some bleeding December 23 of the year 2012.  An endometrial biopsy 08/2011 office revealed endometrial cancer.The specimen was scant and fragmented and they were not able to give her final classification or grating.  Interval History:  10/06/2011 underwent RTLH BSO BPLND at [facility] by Dr. Kyla Pugh at [facility].  Path Diagnosis:  A:Histologic type: Carcinosarcoma (malignant mixed M llerian tumor), heterologous type with rhabdomyosarcomatous differentiation (see comment) Histologic grade: FIGO grade 3 Tumor site: Primary endometrial cancer Myometrial invasion: Near transmural invasion in the anterior and posterior lower uterine segment, 1.5 cm in depth, less than 1 mm from the serosa Serosal involvement: Extremely close, less than 1 mm to serosal surface Cervical involvement: - Not involved by the endometrial carcinosarcoma. - High grade squamous intraepithelial lesion, cervical intraepithelial neoplasia grade 3 (CIN 3) is present in multiple sections from the cervix with extension into superficial endocervical glands, margins are uninvolved Other involved sites: left pelvic lymph nodes Lymphovascular space invasion: Present Regional lymph nodes (see other specimens B and C): Total number involved: 2  Total number examined: 13 FIGO (2009 classification) Stage Grouping: IIIC1  The patient has received three cycles of Taxoterel/Carboplatin.  CT scan Abd/Pelvis 12/2011 without evidence of residual disease.  Allergy:  Allergies  Allergen Reactions  . Iodine Swelling and Rash    Iodine contrast    Social Hx:   Accompanied by her son and daughter-in law. Past Surgical Hx:  Consistent of a bladder tacking for urinary incontinence. She is a gravida 4 para 4. At mastectomy for an early stage breast cancer in 2002 and did not require any  additional chemotherapy radiation or adjuvant hormonal management. Past Surgical History  Procedure Date  . Cataract extraction 1987    Left eye  . Cataract extraction 2002    Right eye  . Retinal detachment surgery 2000  . Bladder repair 2001  . Tubal ligation 1966  . Appendectomy 1966  . Mastectomy 2002    left  . Abdominal hysterectomy   . Tee without cardioversion 12/01/2011    Procedure: TRANSESOPHAGEAL ECHOCARDIOGRAM (TEE);  Surgeon: Kaylee Rodriguez, MD;  Location: [facility] ENDOSCOPY;  Service: Cardiovascular;  Laterality: N/A;  . Cardioversion 12/01/2011    Procedure: CARDIOVERSION;  Surgeon: Kaylee Rodriguez, MD;  Location: [facility] ENDOSCOPY;  Service: Cardiovascular;  Laterality: N/A;    Past Medical Hx:  Past Medical History  Diagnosis Date  . Breast cancer   . Hypertension   . Hyperlipidemia   . Thyroid disease   . Osteoporosis   . Post-menopausal bleeding   . 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 Low sodium     hx of x 2 per pt and son    Family Hx:  Family History  Problem Relation Age of Onset  . Diabetes Mother   . Heart disease Father   . Kidney disease Sister    REVIEW OF SYSTEMS Constitutional  Feels well  Cardiovascular  No chest pain, shortness of breath improved over the past week,  Pulmonary  No cough or wheeze no hemoptysis Gastro Intestinal  No nausea, vomitting, or diarrhoea. Wicked hemmorhoids. Intermittent crampy abdominal pain. constipation.  With chemotherapy. Genito Urinary  No frequency, urgency, dysuria.  Reports nocturia longstanding. Musculo Skeletal  Occasional  Myalgia, Neurologic  No weakness, reports numbness of  the feet and hands lonstanding, no change in gait,  Psychology  No depression, anxiety, insomnia.    Vitals:  Blood pressure 122/80, pulse 68, temperature 97.5 F (36.4 C), temperature source Oral, resp. rate 18, height 5\' 3"  (1.6 m), weight 151 lb 4.8 oz (68.629 kg).  Physical  Exam: Nourished well developed elderly female in no acute distress. Patient appears younger than stated age.  Neck: No lymphadenopathy no thyromegaly.  Lungs: Clear to auscultation bilaterally.  Cardiovascular: Regular rate and rhythm.  Abdomen: Well-healed infraumbilical vertical incision. There is no evidence of an incisional hernias at the trocar sites. She has palpable 2 cm umbilical hernia that is not tender and reducible. There are no masses or nodularity.  Lymph Nodes No cervical supraclavicular or inguinal lymphadenopathy.  Back:  No CVAT  Pelvic: External genitalia within normal limits no atrophic. The vagina is markedly atrophic. Left paravaginal sidewall prolapse. The cuff is intact, no nodularity in the cul de sac  TThere is no nodularity. There are cul de sac masses.   Rectal:  Deferred secondary to symptomatic hemorrhoids.  Extremities: No edema.   Assessment/Plan: 78 year old with stage IIIC1 uterine carcinosarcoma. Minimally invasive endometrial cancer staging was performed by Dr. Ronita Pugh in March of 2013. She since receive 3 cycles of Taxotere and carboplatin therapy.  CT scan after completion of this phase of therapy is unremarkable for progressive disease. She's seen Dr. Trenton Pugh in consultation the plan is to be skin external beam radiation therapy on July 23rd 2013. A followup visit is scheduled with Dr.Livesay in August for her to begin administration of the last 3 cycles of Taxotere carboplatinum chemotherapy.      Kaylee Schimke, MD 01/25/2012, 2:00 PM

## 2012-02-14 ENCOUNTER — Other Ambulatory Visit: Payer: Self-pay

## 2012-02-14 ENCOUNTER — Telehealth: Payer: Self-pay

## 2012-02-14 NOTE — Telephone Encounter (Signed)
Told Ms. Poucher that Dr. Darrold Span said to callher PCP to be seen about the leg swelling and to Let the radiation doctor know tomorrow when she sees him tomorrow for her weekly check.  Pt. Verbalized understanding. Ms. Rasool did not go to radiation today due to a Temp of 101.  It has come down to 99.5.  Suggested that she let PCPknow this as well.   She stated that she is drinking fluids ~8 oz. Every 2 hours.

## 2012-02-14 NOTE — Telephone Encounter (Addendum)
Kaylee Pugh calling stating that her right leg is more swollen then it has been She is able to bear weight  The swelling does not go down at night. She is not sob.  The cardiologist  Said three weeks ago that the swelling was not related to her heart.

## 2012-03-04 ENCOUNTER — Encounter: Payer: Self-pay | Admitting: Oncology

## 2012-03-04 ENCOUNTER — Telehealth: Payer: Self-pay | Admitting: Oncology

## 2012-03-04 ENCOUNTER — Other Ambulatory Visit (HOSPITAL_BASED_OUTPATIENT_CLINIC_OR_DEPARTMENT_OTHER): Payer: Medicare Other | Admitting: Lab

## 2012-03-04 ENCOUNTER — Ambulatory Visit (HOSPITAL_BASED_OUTPATIENT_CLINIC_OR_DEPARTMENT_OTHER): Payer: Medicare Other | Admitting: Oncology

## 2012-03-04 VITALS — BP 128/76 | HR 70 | Temp 97.1°F | Resp 18 | Ht 63.0 in | Wt 147.0 lb

## 2012-03-04 DIAGNOSIS — C549 Malignant neoplasm of corpus uteri, unspecified: Secondary | ICD-10-CM

## 2012-03-04 DIAGNOSIS — C541 Malignant neoplasm of endometrium: Secondary | ICD-10-CM

## 2012-03-04 DIAGNOSIS — C55 Malignant neoplasm of uterus, part unspecified: Secondary | ICD-10-CM

## 2012-03-04 DIAGNOSIS — I4891 Unspecified atrial fibrillation: Secondary | ICD-10-CM

## 2012-03-04 LAB — CBC WITH DIFFERENTIAL/PLATELET
BASO%: 0.3 % (ref 0.0–2.0)
Basophils Absolute: 0 10*3/uL (ref 0.0–0.1)
HCT: 30.5 % — ABNORMAL LOW (ref 34.8–46.6)
HGB: 10.1 g/dL — ABNORMAL LOW (ref 11.6–15.9)
LYMPH%: 7.5 % — ABNORMAL LOW (ref 14.0–49.7)
MCHC: 33.2 g/dL (ref 31.5–36.0)
MONO#: 0.6 10*3/uL (ref 0.1–0.9)
NEUT%: 73.2 % (ref 38.4–76.8)
Platelets: 204 10*3/uL (ref 145–400)
WBC: 4.1 10*3/uL (ref 3.9–10.3)
lymph#: 0.3 10*3/uL — ABNORMAL LOW (ref 0.9–3.3)

## 2012-03-04 LAB — COMPREHENSIVE METABOLIC PANEL (CC13)
BUN: 13 mg/dL (ref 7.0–26.0)
CO2: 23 mEq/L (ref 22–29)
Calcium: 9.2 mg/dL (ref 8.4–10.4)
Chloride: 101 mEq/L (ref 98–107)
Creatinine: 1 mg/dL (ref 0.6–1.1)
Glucose: 101 mg/dl — ABNORMAL HIGH (ref 70–99)
Total Bilirubin: 0.5 mg/dL (ref 0.20–1.20)
Total Protein: 5.9 g/dL — ABNORMAL LOW (ref 6.4–8.3)

## 2012-03-04 NOTE — Patient Instructions (Signed)
Appointments as scheduled. 

## 2012-03-04 NOTE — Progress Notes (Signed)
OFFICE PROGRESS NOTE   03/04/2012   Physicians:J.Soper, J.Kinard, M.Mahoney (PCP, [name]), Phillip Heal (cardiology, [name]), O'Neal (GI, [place])   INTERVAL HISTORY:  Patient is seen, together with a friend, in follow up of her uterine carcinosarcoma, as we plan additional 3 cycles of chemotherapy post RT. She is to complete external beam RT in [place] on 03-05-12, then is scheduled for HDR in [facility] 9-3,9-10,9-17 and 04-02-12.  History is of vaginal spotting beginning Dec 2012, with gyn evaluation delayed because of orthopedic problems then. She saw gynecologist in Feb 2013, with endometrial biopsy revealing endometrial cancer, tho specimen was scant. She was seen in consultation by Dr.Paola Duard Brady 09-27-2011 and had surgery by Dr.Soper at [facility] 10-06-2011, which was robotic hysterectomy/BSO/ bilateral pelvic lymphadenectomy (periaortic nodes not visualized). [facility] [phone]) from 10-06-11 showed carcinosarcoma, heterologous type with rhabdomyosarcomatous differentiation, FIGO grade 3, near transmural invasion in anterior and posterior lower uterine segment (less than 1 mm from serosal surface). 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 With high risk for recurrence in upper abdomen and for distant recurrence, Dr Kyla Balzarine has recommended taxane + carboplatin given in sandwich fashion with RT. Taxotere was chosen in preference to taxol due to preexisting peripheral neuropathy. She was hospitalized in [place] after cycle 1 chemotherapy with symptomatic Afib with RVR. Attempts at cardioversion were not successful during that hospitalization. She is being followed by cardiology in [place] now. She was initially on xarelto, changed to ASA particularly due to thrombocytopenia with chemotherapy. CT AP done in [facility] 01-25-12 showed small free pelvic fluid without clear evidence of cancer now (does have bilateral renal cortical atrophy, diverticulosis and cholelithiasis as well as probably benign pancreatic cystic lesion). RLE venous doppler done in [place] ~ 02-14-12 reportedly was negative for DVT.   Patient has been tolerating the external beam RT reasonably well, without much nausea. She had diarrhea last week, possibly also due to Augmentin then, but normal bowel movement this am and she is back on stool softner. She has been mostly driving herself to RT appointments in [place].  She has been on antibiotics from Aug 12 completed recently, for area on right foot that has been uncomfortable since procedure there for callous. She is to be seen at wound center in [place] area later this week; the area is no longer draining. She had temperature of 101 on 02-13-12, which has resolved. She is still fatigued, but has been able to do some yard work with flowers. She denies chest pain, is intermittently short of breath, but overall seems some better also with this. She denies abdominal or pelvic pain or bleeding. She is eating. She still gets up every 2 hrs to void, as has been case x years; I have again encouraged her to let us refer to urology after present treatment is completed. Hemorrhoids are still somewhat symptomatic, not better with sitz baths that she can tell, not bleeding.  Remainder of 10 point Review of Systems negative.  Objective:  Vital signs in last 24 hours:  BP 128/76  Pulse 70  Temp 97.1 F (36.2 C) (Oral)  Resp 18  Ht 5\' 3"  (1.6 m)  Wt 147 lb (66.679 kg)  BMI 26.04 kg/m2 Weight is ~ stable from my last visit 01-15-12. Alert, looks comfortable, ambulatory in exam room without difficulty. No complete alopecia.  HEENT:PERRLA, sclera  clear, anicteric, oropharynx clear, no lesions and neck supple with midline trachea LymphaticsCervical, supraclavicular, and axillary nodes normal.No inguinal  adenopathy Resp: clear to auscultation bilaterally and normal percussion bilaterally Cardio: irregularly irregular rhythm GI: soft, non-tender; bowel sounds normal; no masses,  no organomegaly Extremities: 1+ swelling RLE with none on left, no cords or tenderness in calf.Area of thick callous on metatarsal heads right foot no drainage or bleeding, no streaking onto foot or leg. Significant arthritic deformities of toes. Neuro:nonfocal Skin without rash or ecchymoses No central catheter   Lab Results:  Results for orders placed in visit on 03/04/12  CBC WITH DIFFERENTIAL      Component Value Range   WBC 4.1  3.9 - 10.3 10e3/uL   NEUT# 3.0  1.5 - 6.5 10e3/uL   HGB 10.1 (*) 11.6 - 15.9 g/dL   HCT 16.1 (*) 09.6 - 04.5 %   Platelets 204  145 - 400 10e3/uL   MCV 84.5  79.5 - 101.0 fL   MCH 28.1  25.1 - 34.0 pg   MCHC 33.2  31.5 - 36.0 g/dL   RBC 4.09 (*) 8.11 - 9.14 10e6/uL   RDW 19.8 (*) 11.2 - 14.5 %   lymph# 0.3 (*) 0.9 - 3.3 10e3/uL   MONO# 0.6  0.1 - 0.9 10e3/uL   Eosinophils Absolute 0.2  0.0 - 0.5 10e3/uL   Basophils Absolute 0.0  0.0 - 0.1 10e3/uL   NEUT% 73.2  38.4 - 76.8 %   LYMPH% 7.5 (*) 14.0 - 49.7 %   MONO% 13.8  0.0 - 14.0 %   EOS% 5.2  0.0 - 7.0 %   BASO% 0.3  0.0 - 2.0 %  COMPREHENSIVE METABOLIC PANEL (CC13)      Component Value Range   Sodium 133 (*) 136 - 145 mEq/L   Potassium 4.0  3.5 - 5.1 mEq/L   Chloride 101  98 - 107 mEq/L   CO2 23  22 - 29 mEq/L   Glucose 101 (*) 70 - 99 mg/dl   BUN 78.2  7.0 - 95.6 mg/dL   Creatinine 1.0  0.6 - 1.1 mg/dL   Total Bilirubin 2.13  0.20 - 1.20 mg/dL   Alkaline Phosphatase 76  40 - 150 U/L   AST 18  5 - 34 U/L   Total Protein 5.9 (*) 6.4 - 8.3 g/dL   ALT 12  0 - 55 U/L   Albumin 3.6  3.5 - 5.0 g/dL   Calcium 9.2  8.4 - 08.6 mg/dL     Studies/Results:  No results found.  Medications: I have reviewed the patient's current medications.  Assessment/Plan: 1.Uterine carcinosarcoma: IIIC1 with history as above: she  looks better overall today than when I saw her last, but is reluctant to resume combination chemotherapy. After discussion she does agree to trying single agent carboplatin with HDR, particularly as we can coordinate treatment with her RT in [place] on 03-19-12 and coordinate my follow up visit also with RT in [place] on 04-02-12. We will consider adding back taxotere from there. 2. Atrial fibrillation with RVR May 2013, followed now in [place] 3. Right foot discomfort since intervention for thick callous in last few months: to be seen at wound center  4.hemorrhoids as above. She is known to GI in [place] from previous colonoscopy 5.bilateral renal cortical atrophy by CT 6.cholelithiasis by CT Patient followed discussion well and is in agreement with plan as above.  Reece Packer, MD   03/04/2012, 4:48 PM

## 2012-03-04 NOTE — Telephone Encounter (Signed)
appts made and printed for pt aom °

## 2012-03-05 ENCOUNTER — Other Ambulatory Visit: Payer: Self-pay | Admitting: Oncology

## 2012-03-05 ENCOUNTER — Telehealth: Payer: Self-pay | Admitting: *Deleted

## 2012-03-05 NOTE — Telephone Encounter (Signed)
CALLED PATIENT TO INFORM OF APPTS. FOR 03-12-12, 03-19-12, 03-26-12 AND 04-02-12, SPOKE WITH PATIENT'S SON- KEVIN AND THEY ARE AWARE OF THESE APPTS.

## 2012-03-08 ENCOUNTER — Telehealth: Payer: Self-pay | Admitting: *Deleted

## 2012-03-08 NOTE — Telephone Encounter (Signed)
CALLED PATIENT TO REMIND OF APPTS. FOR 03-12-12, LVM FOR A RETURN CALL

## 2012-03-12 ENCOUNTER — Ambulatory Visit
Admission: RE | Admit: 2012-03-12 | Discharge: 2012-03-12 | Disposition: A | Discharge status: Home / Self Care | Payer: Medicare Other | Source: Ambulatory Visit | Attending: Radiation Oncology | Admitting: Radiation Oncology

## 2012-03-12 VITALS — BP 126/72 | HR 71 | Temp 97.6°F | Wt 145.7 lb

## 2012-03-12 DIAGNOSIS — Z510 Encounter for antineoplastic radiation therapy: Secondary | ICD-10-CM | POA: Insufficient documentation

## 2012-03-12 DIAGNOSIS — C55 Malignant neoplasm of uterus, part unspecified: Secondary | ICD-10-CM

## 2012-03-12 NOTE — Progress Notes (Signed)
   Department of Radiation Oncology  Phone:  [phone] Fax:        [phone]   Simulation and treatment planning note:  This patient was taken to the CT simulation room.  patient's vaginal cylinder was reinserted. A CT scan was performed through the pelvis region. vaginal cylinder appeared to be  in good position and distended the vaginal vault well. Patient also  undergo planning for her high-dose-rate treatment. this will be treated 600 centigrade to the proximal vaginal mucosal surface. Treatment length will be 3 cm. This will receive 3 weekly treatments for a brachii therapy  dose of 1800 centigrade. Cumulative dose to the proximal vagina will be 6300 centigrade.  -----------------------------------  Billie Lade, PhD, MD

## 2012-03-12 NOTE — Progress Notes (Signed)
   Department of Radiation Oncology  Phone:  [phone] Fax:        [phone]   Verification simulation note:  Patient was transferred to the high dose rate suite. The vaginal cylinder was reinserted with the fiducial marker in place. Patient then proceeded to undergo AP and lateral film. This was compared to the patient's planning films earlier in the day documenting good position of the vaginal cylinder for treatment.  High-dose-rate brachytherapy procedure:  The vaginal cylinder was attached to the remote afterloading device. Patient  did undergo her first high-dose-rate treatment. She was prescribed a dose of 600 cGy to the proximal mucosal surface. This was achieved with a total dwell time of 255.7 seconds. Patient was treated with 1 channel using 7 dwell positions. She tolerated treatment well. After completion of her therapy a radiation survey was performed documenting return of the iridium source into the Nucletron safe. She will return next week for her second high-dose-rate treatment.  -----------------------------------  Billie Lade, PhD, MD

## 2012-03-12 NOTE — Progress Notes (Signed)
   Department of Radiation Oncology  Phone:  [phone] Fax:        [phone]   Treatment note:  Patient presents today prior to her  planning and treatment. The patient completed 4500 cGy external beam  treatments at the [facility] in [place].  Patient denies any further problems with diarrhea. She does have some mild fatigue. I went over the course of treatment side effects and potential toxicities of this therapy with the patient and her daughter-in-law. Patient wishes to proceed with planned course of treatment.  Billie Lade, PhD, MD

## 2012-03-12 NOTE — Progress Notes (Signed)
   Department of Radiation Oncology  Phone:  [phone] Fax:        [phone]   Vaginal brachii therapy procedure note:  This patient was taken to the nursing suite and placed placed in the dorsolithotomy position.  a pelvic exam was performed documenting no evidence recurrence along the vaginal cuff area or pelvis region. Patient proceeded to undergo serial dilation of the vaginal introitus. Patient proceeded to undergo fitting for her vaginal cylinder.  the optimal diameter for treatment will be a 2-1/2 cm diameter cylinder.  patient tolerated the procedure well.  Simple treatment device note:  Patient had construction of her custom vaginal cylinder for treatment. This will be treated with a 2-1/2 cm diameter cylinder using 3 rings.  More distally will be two  2.0 cm rings.

## 2012-03-17 ENCOUNTER — Other Ambulatory Visit: Payer: Self-pay | Admitting: Oncology

## 2012-03-17 DIAGNOSIS — C55 Malignant neoplasm of uterus, part unspecified: Secondary | ICD-10-CM

## 2012-03-18 ENCOUNTER — Telehealth: Payer: Self-pay | Admitting: *Deleted

## 2012-03-18 ENCOUNTER — Telehealth: Payer: Self-pay | Admitting: Oncology

## 2012-03-18 NOTE — Telephone Encounter (Signed)
Moved 9/10 appt from JG to LL (error). Pt moved to LL @ 10:30 am per Sallye Ober. S/w pt son re new time for 9/10 @ 10 am.

## 2012-03-18 NOTE — Telephone Encounter (Signed)
CALLED PATIENT TO REMIND OF HDR TX. FOR 03-19-12, LVM FOR A RETURN CALL

## 2012-03-19 ENCOUNTER — Ambulatory Visit: Payer: Medicare Other | Admitting: Oncology

## 2012-03-19 ENCOUNTER — Encounter: Payer: Self-pay | Admitting: Oncology

## 2012-03-19 ENCOUNTER — Ambulatory Visit (HOSPITAL_BASED_OUTPATIENT_CLINIC_OR_DEPARTMENT_OTHER): Payer: Medicare Other

## 2012-03-19 ENCOUNTER — Other Ambulatory Visit (HOSPITAL_BASED_OUTPATIENT_CLINIC_OR_DEPARTMENT_OTHER): Payer: Medicare Other

## 2012-03-19 ENCOUNTER — Ambulatory Visit (HOSPITAL_BASED_OUTPATIENT_CLINIC_OR_DEPARTMENT_OTHER): Payer: Medicare Other | Admitting: Oncology

## 2012-03-19 ENCOUNTER — Telehealth: Payer: Self-pay | Admitting: Oncology

## 2012-03-19 ENCOUNTER — Ambulatory Visit
Admission: RE | Admit: 2012-03-19 | Discharge: 2012-03-19 | Disposition: A | Discharge status: Home / Self Care | Payer: Medicare Other | Source: Ambulatory Visit | Attending: Radiation Oncology | Admitting: Radiation Oncology

## 2012-03-19 ENCOUNTER — Other Ambulatory Visit: Payer: Medicare Other | Admitting: Lab

## 2012-03-19 VITALS — BP 137/84 | HR 73 | Temp 97.0°F | Resp 20 | Ht 63.0 in | Wt 143.2 lb

## 2012-03-19 DIAGNOSIS — C55 Malignant neoplasm of uterus, part unspecified: Secondary | ICD-10-CM

## 2012-03-19 DIAGNOSIS — I4891 Unspecified atrial fibrillation: Secondary | ICD-10-CM

## 2012-03-19 DIAGNOSIS — R350 Frequency of micturition: Secondary | ICD-10-CM

## 2012-03-19 DIAGNOSIS — Z5111 Encounter for antineoplastic chemotherapy: Secondary | ICD-10-CM

## 2012-03-19 LAB — CBC WITH DIFFERENTIAL/PLATELET
Eosinophils Absolute: 0.1 10*3/uL (ref 0.0–0.5)
HCT: 33.6 % — ABNORMAL LOW (ref 34.8–46.6)
LYMPH%: 14.9 % (ref 14.0–49.7)
MONO#: 0.4 10*3/uL (ref 0.1–0.9)
NEUT#: 3.3 10*3/uL (ref 1.5–6.5)
NEUT%: 72.9 % (ref 38.4–76.8)
Platelets: 148 10*3/uL (ref 145–400)
WBC: 4.6 10*3/uL (ref 3.9–10.3)

## 2012-03-19 MED ORDER — SODIUM CHLORIDE 0.9 % IV SOLN
271.6000 mg | Freq: Once | INTRAVENOUS | Status: AC
  Administered 2012-03-19: 270 mg via INTRAVENOUS
  Filled 2012-03-19: qty 27

## 2012-03-19 MED ORDER — SODIUM CHLORIDE 0.9 % IV SOLN
Freq: Once | INTRAVENOUS | Status: AC
  Administered 2012-03-19: 12:00:00 via INTRAVENOUS

## 2012-03-19 MED ORDER — ONDANSETRON 16 MG/50ML IVPB (CHCC)
16.0000 mg | Freq: Once | INTRAVENOUS | Status: AC
  Administered 2012-03-19: 16 mg via INTRAVENOUS

## 2012-03-19 MED ORDER — DEXAMETHASONE SODIUM PHOSPHATE 4 MG/ML IJ SOLN
20.0000 mg | Freq: Once | INTRAMUSCULAR | Status: AC
  Administered 2012-03-19: 20 mg via INTRAVENOUS

## 2012-03-19 NOTE — Progress Notes (Signed)
   Department of Radiation Oncology  Phone:  [phone] Fax:        [phone]   Vaginal brachii therapy procedure  The patient was taken to the high dose rate suite and placed in the dorsolithotomy position. A pelvic exam was performed documenting no evidence recurrence. Patient proceeded to undergo serial dilation of the vaginal introitus. The patient's vaginal cylinder was then inserted. This was affixed to the CT/MR stabilization plate to prevent slippage. She tolerated the procedure well  Simple treatment device  Patient had construction of her custom vaginal cylinder for treatment. She will be treated with  Two  2-1/2 cm diameter cylinder using 3 rings proximal and 2-2.0 cms more distal.   Verification simulation  A fiducial marker was placed within the vaginal cylinder. The patient then had an AP and lateral film obtained. This was compared to the patient's planning films documenting accurate position for treatment.  High-dose-rate brachytherapy procedure  The vaginal cylinder was attached to the remote afterloading device. Patient proceeded to undergo her second high-dose-rate treatment. She was prescribed a dose of 600 cGy to be delivered to the proximal mucosal surface. This was achieved with a total dwell time of 273 seconds. Patient was treated with 1 channel using 7 dwell positions. She tolerated the treatment well. After completion of her therapy a radiation survey was performed documenting return of the iridium source into the Nucletron safe.  she will return next week for her third high-dose-rate treatment.  -----------------------------------  Billie Lade, PhD, MD

## 2012-03-19 NOTE — Telephone Encounter (Signed)
appts made and printed for pt  °

## 2012-03-19 NOTE — Patient Instructions (Addendum)
[facility] Discharge Instructions for Patients Receiving Chemotherapy  Today you received the following chemotherapy agents Carboplatin To help prevent nausea and vomiting after your treatment, we encourage you to take your nausea medication as prescribed.   If you develop nausea and vomiting that is not controlled by your nausea medication, call the clinic. If it is after clinic hours your family physician or the after hours number for the clinic or go to the Emergency Department.   BELOW ARE SYMPTOMS THAT SHOULD BE REPORTED IMMEDIATELY:  *FEVER GREATER THAN 100.5 F  *CHILLS WITH OR WITHOUT FEVER  NAUSEA AND VOMITING THAT IS NOT CONTROLLED WITH YOUR NAUSEA MEDICATION  *UNUSUAL SHORTNESS OF BREATH  *UNUSUAL BRUISING OR BLEEDING  TENDERNESS IN MOUTH AND THROAT WITH OR WITHOUT PRESENCE OF ULCERS  *URINARY PROBLEMS  *BOWEL PROBLEMS  UNUSUAL RASH Items with * indicate a potential emergency and should be followed up as soon as possible.  One of the nurses will contact you 24 hours after your treatment. Please let the nurse know about any problems that you may have experienced. Feel free to call the clinic you have any questions or concerns. The clinic phone number is [phone].   I have been informed and understand all the instructions given to me. I know to contact the clinic, my physician, or go to the Emergency Department if any problems should occur. I do not have any questions at this time, but understand that I may call the clinic during office hours   should I have any questions or need assistance in obtaining follow up care.    __________________________________________  _____________  __________ Signature of Patient or Authorized Representative            Date                   Time    __________________________________________ Nurse's Signature    

## 2012-03-19 NOTE — Patient Instructions (Signed)
Appointments as scheduled. 

## 2012-03-19 NOTE — Progress Notes (Signed)
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 She continues HDR. She seems to be feeling some better overall since I saw her in late August, with some improvement in overall energy and progressive healing of her foot. External beam RT completed in [place] since she was here last; first HDR was 03-12-12 and she is due #2/ 3 today.  History is of vaginal spotting beginning Dec 2012, with gyn evaluation delayed because of orthopedic problems then. She saw gynecologist in Feb 2013, with endometrial biopsy revealing endometrial cancer, tho specimen was scant. She was seen in consultation by Dr.Paola Duard Brady 09-27-2011 and had surgery by Dr.Soper at [facility] 10-06-2011, which was robotic hysterectomy/BSO/ bilateral pelvic lymphadenectomy (periaortic nodes not visualized). [facility] [phone]) from 10-06-11 showed carcinosarcoma, heterologous type with rhabdomyosarcomatous differentiation, FIGO grade 3, near transmural invasion in anterior and posterior lower uterine segment (less than 1 mm from serosal surface). 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 With high risk for recurrence in upper abdomen and for distant recurrence, Dr Kyla Balzarine has recommended taxane + carboplatin given in sandwich fashion with RT. Taxotere was chosen in preference to taxol due to preexisting peripheral neuropathy. She was hospitalized in [place] after cycle 1 chemotherapy with symptomatic Afib with RVR. Attempts at cardioversion were not successful during that hospitalization. She is being followed by  cardiology in [place] now, next apt there in Oct. She was initially on xarelto, changed to ASA particularly due to thrombocytopenia with chemotherapy. 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 RLE venous doppler done in [place] ~ 02-14-12 reportedly was negative for DVT.   Patient felt well enough to walk outdoors last 2 days. She had some nausea for first time recently today, improved with antiemetic. She is eating regular diet. Foot is much improved and she does not want follow up at wound center. She has had no fever or symptoms of infection. She complains again of chronic nocturia, and hopefully she will agree to urology referral after chemo completes. Remainder of 10 point Review of Systems negative.  Objective:  Vital signs in last 24 hours:  BP 137/84  Pulse 73  Temp 97 F (36.1 C) (Oral)  Resp 20  Ht 5\' 3"  (1.6 m)  Wt 143 lb 3.2 oz (64.955 kg)  BMI 25.37 kg/m2Weight is down 4 lbs.    HEENT:PERRLA, sclera clear, anicteric and oropharynx clear, no lesions. No JVD. LymphaticsCervical, supraclavicular, and axillary nodes normal.No inguinal adenopathy Resp: clear to auscultation bilaterally and normal percussion bilaterally Cardio: irregularly irregular rhythm, no gallop GI: soft, non-tender; bowel sounds normal; no masses,  no organomegaly Extremities: no pitting edema. open area in central callous left metatarsal heads much improved, now only very superficial No central catheter  Lab Results:  Results for orders placed in visit on 03/19/12  CBC WITH DIFFERENTIAL      Component Value Range   WBC 4.6  3.9 - 10.3 10e3/uL   NEUT# 3.3  1.5 - 6.5 10e3/uL  HGB 11.1 (*) 11.6 - 15.9 g/dL   HCT 86.5 (*) 78.4 - 69.6 %   Platelets 148  145 - 400 10e3/uL   MCV 82.2  79.5 - 101.0 fL   MCH  27.1  25.1 - 34.0 pg   MCHC 33.0  31.5 - 36.0 g/dL   RBC 2.95  2.84 - 1.32 10e6/uL   RDW 16.3 (*) 11.2 - 14.5 %   lymph# 0.7 (*) 0.9 - 3.3 10e3/uL   MONO# 0.4  0.1 - 0.9 10e3/uL   Eosinophils Absolute 0.1  0.0 - 0.5 10e3/uL   Basophils Absolute 0.0  0.0 - 0.1 10e3/uL   NEUT% 72.9  38.4 - 76.8 %   LYMPH% 14.9  14.0 - 49.7 %   MONO% 9.6  0.0 - 14.0 %   EOS% 2.4  0.0 - 7.0 %   BASO% 0.2  0.0 - 2.0 %   nRBC 0  0 - 0 %     Studies/Results:  No results found.  Medications: I have reviewed the patient's current medications. Patient and son are in agreement with proceding with cycle 4 chemo today, which will be single agent carbo due to frail condition and previous problems with taxol. We will consider adding taxol back with remaining 2 cycles if possible.  Assessment/Plan: 1. Uterine carcinosarcoma: history as above, to continue chemotherapy as HDR completes. 2.A fib: symptoms seem stable 3. History of degenerative back problems 4.HTN, elevated lipids 5.vision problems OS around time of onset gyn symptoms 6. left mastectomy WITHOUT axillary node removal for early stage breast cancer 2002 7. Urinary frequency especially at night x years, with sleep difficulty related. See above  I will see her back on 03-26-12 when she is at [facility] for last HDR. She may need gCSF depending on counts then. Patient and son were in agreement with plan as above.l     Myrtis Maille P, MD   03/19/2012, 11:44 AM

## 2012-03-20 ENCOUNTER — Telehealth: Payer: Self-pay | Admitting: Oncology

## 2012-03-20 NOTE — Telephone Encounter (Signed)
On call: patient called due to sharp pain in neck going down back and down both arms, began ~ 3.5 hrs ago when she went out to grocery store, not relieved with hydrocodone X 1. Long history of problems with neck, never this severe. Told her this would not be related to carboplatin, recommended she call family and be evaluated at ED, possibly cervical disc problem. Ok to take another hydrocodone now. Pt understood and agreed. Ila Mcgill, MD

## 2012-03-22 ENCOUNTER — Telehealth: Payer: Self-pay | Admitting: Medical Oncology

## 2012-03-22 NOTE — Telephone Encounter (Signed)
I returned Kaylee Pugh"s call -left message to return my call.

## 2012-03-22 NOTE — Telephone Encounter (Signed)
Mr. Coppinger left message in nurse's vm as to whether or not it was ok for his mother  to use ibuprofen for discomfort since resuming chemotherapy. LM stating that Dr. Darrold Span said that his mother's plt. count was fine at visit 03-19-12 to use ibuprofen with food a couple of times a day.

## 2012-03-25 ENCOUNTER — Other Ambulatory Visit: Payer: Self-pay | Admitting: *Deleted

## 2012-03-25 ENCOUNTER — Telehealth: Payer: Self-pay | Admitting: *Deleted

## 2012-03-25 MED ORDER — FLUCONAZOLE 100 MG PO TABS: 100.0000 mg | ORAL_TABLET | Freq: Every day | ORAL | Status: DC

## 2012-03-25 NOTE — Telephone Encounter (Signed)
CALLED PATIENT TO REMIND OF HDR TX. FOR 03-26-12, SPOKE WITH PATIENT'S SON- KEVIN AND HE IS AWARE OF THIS APPT.

## 2012-03-26 ENCOUNTER — Ambulatory Visit (HOSPITAL_BASED_OUTPATIENT_CLINIC_OR_DEPARTMENT_OTHER): Payer: Medicare Other | Admitting: Oncology

## 2012-03-26 ENCOUNTER — Ambulatory Visit
Admission: RE | Admit: 2012-03-26 | Discharge: 2012-03-26 | Disposition: A | Discharge status: Home / Self Care | Payer: Medicare Other | Source: Ambulatory Visit | Attending: Radiation Oncology | Admitting: Radiation Oncology

## 2012-03-26 ENCOUNTER — Telehealth: Payer: Self-pay | Admitting: Oncology

## 2012-03-26 ENCOUNTER — Encounter: Payer: Self-pay | Admitting: Radiation Oncology

## 2012-03-26 ENCOUNTER — Encounter: Payer: Self-pay | Admitting: Oncology

## 2012-03-26 ENCOUNTER — Other Ambulatory Visit (HOSPITAL_BASED_OUTPATIENT_CLINIC_OR_DEPARTMENT_OTHER): Payer: Medicare Other | Admitting: Lab

## 2012-03-26 VITALS — BP 153/91 | HR 87 | Temp 97.0°F | Resp 87 | Ht 63.0 in | Wt 142.7 lb

## 2012-03-26 DIAGNOSIS — C55 Malignant neoplasm of uterus, part unspecified: Secondary | ICD-10-CM

## 2012-03-26 DIAGNOSIS — M542 Cervicalgia: Secondary | ICD-10-CM

## 2012-03-26 DIAGNOSIS — I10 Essential (primary) hypertension: Secondary | ICD-10-CM

## 2012-03-26 DIAGNOSIS — I4891 Unspecified atrial fibrillation: Secondary | ICD-10-CM

## 2012-03-26 LAB — CBC WITH DIFFERENTIAL/PLATELET
Eosinophils Absolute: 0 10*3/uL (ref 0.0–0.5)
LYMPH%: 8 % — ABNORMAL LOW (ref 14.0–49.7)
MCH: 28.1 pg (ref 25.1–34.0)
MCHC: 33.1 g/dL (ref 31.5–36.0)
MCV: 84.8 fL (ref 79.5–101.0)
MONO%: 11.7 % (ref 0.0–14.0)
Platelets: 229 10*3/uL (ref 145–400)
RBC: 3.96 10*6/uL (ref 3.70–5.45)

## 2012-03-26 LAB — COMPREHENSIVE METABOLIC PANEL (CC13)
Alkaline Phosphatase: 91 U/L (ref 40–150)
Glucose: 87 mg/dl (ref 70–99)
Sodium: 133 mEq/L — ABNORMAL LOW (ref 136–145)
Total Bilirubin: 0.6 mg/dL (ref 0.20–1.20)
Total Protein: 6.3 g/dL — ABNORMAL LOW (ref 6.4–8.3)

## 2012-03-26 NOTE — Telephone Encounter (Signed)
Gave pt appt for lab and MD, emailed Marcelino Duster regarding next chemo

## 2012-03-26 NOTE — Progress Notes (Signed)
OFFICE PROGRESS NOTE   03/26/2012   Physicians:J.Soper, J.Kinard, M.Mahoney (PCP, [name]), Phillip Heal (cardiology, [name]), O'Neal (GI, [name]), O'Toole (pain clinic [place])   INTERVAL HISTORY:  Patient is seen, alone for visit, in continuing attention to chemotherapy in process for uterine carcinosarcoma.Due to poor performance status in setting of other recent health problems, she has had carboplatin only for cycle 4 treatment given 03-19-12.   History is of vaginal spotting beginning Dec 2012, with gyn evaluation delayed because of orthopedic problems then. She saw gynecologist in Feb 2013, with endometrial biopsy revealing endometrial cancer, tho specimen was scant. She was seen in consultation by Dr.Paola Duard Brady 09-27-2011 and had surgery by Dr.Soper at [facility] 10-06-2011, which was robotic hysterectomy/BSO/ bilateral pelvic lymphadenectomy (periaortic nodes not visualized). [facility] [id number]) from 10-06-11 showed carcinosarcoma, heterologous type with rhabdomyosarcomatous differentiation, FIGO grade 3, near transmural invasion in anterior and posterior lower uterine segment (less than 1 mm from serosal surface). 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 With high risk for recurrence in upper abdomen and for distant recurrence, Dr Kyla Balzarine recommended taxane + carboplatin given in sandwich fashion with RT; taxotere was chosen in preference to taxol due to preexisting peripheral neuropathy. Cycle 1 Ledell Noss /taxotere was given on 11-22-11 and cycle 2 with same drugs on 12-13-11. She was hospitalized in [place] after cycle 1 chemotherapy with symptomatic Afib with RVR. Attempts at cardioversion were not successful; she is being followed by cardiology in [place] now, next apt there in Oct. She was initially on  xarelto, changed to ASA particularly due to thrombocytopenia with chemotherapy. Performance status by cycle 3 did not allow taxotere, with carboplatin only used.  She completed 4500 cGY external beam RT in [place], and is for third and last planned HDR today.  Patient had spoken with me on call 03-20-12 due to acute onset of severe neck pain. She was seen in local ED with Xrays done and soft neck collar applied; patient is to set up appointment with Dr Laurian Brim at pain clinic in [place], as he was involved with treatment of previous back problems. She is more comfortable with the neck collar. She has had some fatigue, no significant nausea, no fever or symptoms of infection, no chest pain or increased cardiac symptoms, no increased shortness of breath, no bleeding, no increased swelling LE, bowels ok, urinary frequency unchanged. Remainder of 10 point Review of Systems negative.  Objective:  Vital signs in last 24 hours:  BP 153/91  Pulse 87  Temp 97 F (36.1 C) (Oral)  Resp 87  Ht 5\' 3"  (1.6 m)  Wt 142 lb 11.2 oz (64.728 kg)  BMI 25.28 kg/m2 Weight is stable. Alert, appears frail,  ambulatory slowly without assistance, wearing soft neck collar. Partial alopecia. Respirations not labored RA.   HEENT:PERRLA, sclera clear, anicteric and oropharynx clear, no lesions Lymphaticsno supraclavicular adenopathy Resp: clear to auscultation bilaterally and normal percussion bilaterally Cardio: irregularly irregular rhythm GI: soft, non-tender; bowel sounds normal; no masses,  no organomegaly Extremities: extremities normal, atraumatic, no cyanosis or edema. She is wearing Ted hose on right, tells me the foot ulcer continues to improve Neuro:no sensory deficits noted No central catheter Skin without rash or ecchymosis Lab Results:  Results for orders placed in visit on 03/26/12  CBC WITH DIFFERENTIAL      Component Value Range  WBC 7.5  3.9 - 10.3 10e3/uL   NEUT# 6.0  1.5 - 6.5 10e3/uL   HGB 11.1  (*) 11.6 - 15.9 g/dL   HCT 16.1 (*) 09.6 - 04.5 %   Platelets 229  145 - 400 10e3/uL   MCV 84.8  79.5 - 101.0 fL   MCH 28.1  25.1 - 34.0 pg   MCHC 33.1  31.5 - 36.0 g/dL   RBC 4.09  8.11 - 9.14 10e6/uL   RDW 18.1 (*) 11.2 - 14.5 %   lymph# 0.6 (*) 0.9 - 3.3 10e3/uL   MONO# 0.9  0.1 - 0.9 10e3/uL   Eosinophils Absolute 0.0  0.0 - 0.5 10e3/uL   Basophils Absolute 0.0  0.0 - 0.1 10e3/uL   NEUT% 79.9 (*) 38.4 - 76.8 %   LYMPH% 8.0 (*) 14.0 - 49.7 %   MONO% 11.7  0.0 - 14.0 %   EOS% 0.3  0.0 - 7.0 %   BASO% 0.1  0.0 - 2.0 %  COMPREHENSIVE METABOLIC PANEL (CC13)      Component Value Range   Sodium 133 (*) 136 - 145 mEq/L   Potassium 3.1 (*) 3.5 - 5.1 mEq/L   Chloride 98  98 - 107 mEq/L   CO2 26  22 - 29 mEq/L   Glucose 87  70 - 99 mg/dl   BUN 78.2  7.0 - 95.6 mg/dL   Creatinine 0.8  0.6 - 1.1 mg/dL   Total Bilirubin 2.13  0.20 - 1.20 mg/dL   Alkaline Phosphatase 91  40 - 150 U/L   AST 12  5 - 34 U/L   ALT 16  0 - 55 U/L   Total Protein 6.3 (*) 6.4 - 8.3 g/dL   Albumin 3.9  3.5 - 5.0 g/dL   Calcium 9.7  8.4 - 08.6 mg/dL     Studies/Results:  No results found.  Medications: I have reviewed the patient's current medications.  Assessment/Plan: 1.Uterine carcinosarcoma: history as above, back on chemotherapy since completion of external beam RT. I do not believe this 78 yo lady can tolerate more aggressive chemotherapy now. She is willing to have carboplatin again on 04-09-12 as long as ANC >=1.5 and plt >=100k. I will see her back 10-14, coordinating with Dr Trina Ao visit same day, and she will be due cycle 6 chemo on 04-30-12. 2.Atrial fibrillation with RVR, controlled now with medication 3. History of degenerative back problems and new neck symptoms, to follow up with pain clinic in [place] 4.HTN, elevated lipids  5.vision problems OS around time of onset gyn symptoms  6. left mastectomy WITHOUT axillary node removal for early stage breast cancer 2002  7. Urinary frequency  especially at night x years, with sleep difficulty related. 8.improvement in foot ulcer. Patient is in agreement with plan above   Cacie Gaskins P, MD   03/26/2012, 8:40 PM

## 2012-03-26 NOTE — Patient Instructions (Signed)
Call Dr Laurian Brim this afternoon to set up appointment

## 2012-03-26 NOTE — Progress Notes (Incomplete)
°  Radiation Oncology         [phone] ________________________________  Name: Kaylee Pugh MRN: [id number]  Date: 03/26/2012  DOB: June 01, 1925  End of Treatment Note  Diagnosis:   ***     Indication for treatment:  ***       Radiation treatment dates:  03/12/2012, 03/19/2012, 03/26/2012  Site/dose:   ***  Beams/energy:   ***  Narrative: The patient tolerated radiation treatment relatively well.   ***  Plan: The patient has completed radiation treatment. The patient will return to radiation oncology clinic for routine followup in one month. I advised them to call or return sooner if they have any questions or concerns related to their recovery or treatment.  -----------------------------------  Billie Lade, PhD, MD

## 2012-03-26 NOTE — Progress Notes (Signed)
Vaginal brachii therapy procedure  The patient was taken to the high dose rate suite and placed in the dorsolithotomy position. A pelvic exam was performed documenting no evidence recurrence. Patient proceeded to undergo serial dilation of the vaginal introitus. The patient's vaginal cylinder was then inserted. This was affixed to the CT/MR stabilization plate to prevent slippage. She tolerated the procedure well   Simple treatment device  Patient had construction of her custom vaginal cylinder for treatment. She will be treated with Two 2-1/2 cm diameter cylinder using 3 rings proximal and 2-2.0 cms more distal.   Verification simulation  A fiducial marker was placed within the vaginal cylinder. The patient then had an AP and lateral film obtained. This was compared to the patient's planning films documenting accurate position for treatment.   High-dose-rate brachytherapy procedure  The vaginal cylinder was attached to the remote afterloading device. Patient proceeded to undergo her third high-dose-rate treatment. She was prescribed a dose of 600 cGy to be delivered to the proximal mucosal surface. This was achieved with a total dwell time of 291.7 seconds. Patient was treated with 1 channel using 7 dwell positions. She tolerated the treatment well. After completion of her therapy a radiation survey was performed documenting return of the iridium source into the Nucletron safe. she will return next week for her third high-dose-rate treatment.   -----------------------------------  Billie Lade, PhD, MD

## 2012-03-27 ENCOUNTER — Telehealth: Payer: Self-pay | Admitting: Oncology

## 2012-03-27 ENCOUNTER — Telehealth: Payer: Self-pay | Admitting: *Deleted

## 2012-03-27 NOTE — Telephone Encounter (Signed)
Called pt and left message regarding October appts, for lab, radiation, Md and chemo

## 2012-03-27 NOTE — Telephone Encounter (Signed)
Per staff message and POF I have scheduled appts.  JMW  

## 2012-03-29 ENCOUNTER — Telehealth: Payer: Self-pay | Admitting: Oncology

## 2012-03-29 NOTE — Telephone Encounter (Signed)
Pt aware of appt in October 2013

## 2012-04-01 ENCOUNTER — Telehealth: Payer: Self-pay | Admitting: Oncology

## 2012-04-01 NOTE — Telephone Encounter (Signed)
Talked to patient's son and gave him appt for 04/09/12

## 2012-04-02 ENCOUNTER — Other Ambulatory Visit: Payer: Medicare Other | Admitting: Lab

## 2012-04-02 ENCOUNTER — Ambulatory Visit: Payer: Medicare Other

## 2012-04-02 ENCOUNTER — Ambulatory Visit: Payer: Medicare Other | Admitting: Oncology

## 2012-04-07 NOTE — Addendum Note (Signed)
Encounter addended by: Billie Lade, MD on: 04/07/2012  1:49 PM<BR>     Documentation filed: Visit Diagnoses, Inpatient Notes, Notes Section

## 2012-04-07 NOTE — Progress Notes (Signed)
  Radiation Oncology         [phone] ________________________________  Name: Braya Stiver MRN: [id number]  Date: 03/26/2012  DOB: 05-13-1925  End of Treatment Note  Diagnosis:   Stage IIIc uterine carcinosarcoma     Indication for treatment:  High risk for recurrence within the pelvis region       Radiation treatment dates:   External beam radiation therapy 01/30/2012 through 03/05/2012 [facility]; the patient received intracavitary brachii therapy treatments on September 3 September 10 and March 26, 2012  Site/dose:   Pelvis 4500 cGy in 25 fractions;  the vaginal cuff area was boosted further to a cumulative dose of 6300 cGy with high-dose-rate brachytherapy using iridium 192.  Beams/energy:   3-D conformal therapy directed at the pelvis area using 18 in the photons; the proximal vagina was boosted further with intracavitary brachytherapy treatments using a 2-1/2 cm  diameter cylinder. Treatment length 3 cm. Brachii therapy dose was 1800 cGy in 3 weekly treatments with prescription to the proximal vaginal mucosal surface.  Narrative: The patient tolerated radiation treatment relatively well.   She did have significant problems with diarrhea during her external beam radiation treatments at the [facility] in [place].  Plan: The patient has completed radiation treatment. The patient will return to radiation oncology clinic for routine followup in one month. I advised them to call or return sooner if they have any questions or concerns related to their recovery or treatment.  -----------------------------------  Billie Lade, PhD, MD

## 2012-04-08 NOTE — Addendum Note (Signed)
Encounter addended by: Delynn Flavin, RN on: 04/08/2012  8:24 PM<BR>     Documentation filed: Charges VN

## 2012-04-09 ENCOUNTER — Other Ambulatory Visit (HOSPITAL_BASED_OUTPATIENT_CLINIC_OR_DEPARTMENT_OTHER): Payer: Medicare Other | Admitting: Lab

## 2012-04-09 ENCOUNTER — Ambulatory Visit (HOSPITAL_BASED_OUTPATIENT_CLINIC_OR_DEPARTMENT_OTHER): Payer: Medicare Other

## 2012-04-09 VITALS — BP 158/76 | HR 76 | Temp 97.0°F | Resp 18

## 2012-04-09 DIAGNOSIS — C55 Malignant neoplasm of uterus, part unspecified: Secondary | ICD-10-CM

## 2012-04-09 DIAGNOSIS — Z5111 Encounter for antineoplastic chemotherapy: Secondary | ICD-10-CM

## 2012-04-09 LAB — CBC WITH DIFFERENTIAL/PLATELET
Basophils Absolute: 0 10*3/uL (ref 0.0–0.1)
Eosinophils Absolute: 0 10*3/uL (ref 0.0–0.5)
HGB: 10 g/dL — ABNORMAL LOW (ref 11.6–15.9)
LYMPH%: 21.3 % (ref 14.0–49.7)
MCV: 83.7 fL (ref 79.5–101.0)
MONO%: 16.3 % — ABNORMAL HIGH (ref 0.0–14.0)
NEUT#: 1.9 10*3/uL (ref 1.5–6.5)
NEUT%: 62.1 % (ref 38.4–76.8)
Platelets: 107 10*3/uL — ABNORMAL LOW (ref 145–400)
RBC: 3.68 10*6/uL — ABNORMAL LOW (ref 3.70–5.45)

## 2012-04-09 MED ORDER — SODIUM CHLORIDE 0.9 % IV SOLN
314.4000 mg | Freq: Once | INTRAVENOUS | Status: AC
  Administered 2012-04-09: 310 mg via INTRAVENOUS
  Filled 2012-04-09: qty 31

## 2012-04-09 MED ORDER — DEXAMETHASONE SODIUM PHOSPHATE 4 MG/ML IJ SOLN
20.0000 mg | Freq: Once | INTRAMUSCULAR | Status: AC
  Administered 2012-04-09: 20 mg via INTRAVENOUS

## 2012-04-09 MED ORDER — SODIUM CHLORIDE 0.9 % IV SOLN
Freq: Once | INTRAVENOUS | Status: AC
  Administered 2012-04-09: 11:00:00 via INTRAVENOUS

## 2012-04-09 MED ORDER — ONDANSETRON 16 MG/50ML IVPB (CHCC)
16.0000 mg | Freq: Once | INTRAVENOUS | Status: AC
  Administered 2012-04-09: 16 mg via INTRAVENOUS

## 2012-04-09 NOTE — Patient Instructions (Addendum)
[facility] Discharge Instructions for Patients Receiving Chemotherapy  Today you received the following chemotherapy agents Carboplatin To help prevent nausea and vomiting after your treatment, we encourage you to take your nausea medication as prescribed.   If you develop nausea and vomiting that is not controlled by your nausea medication, call the clinic. If it is after clinic hours your family physician or the after hours number for the clinic or go to the Emergency Department.   BELOW ARE SYMPTOMS THAT SHOULD BE REPORTED IMMEDIATELY:  *FEVER GREATER THAN 100.5 F  *CHILLS WITH OR WITHOUT FEVER  NAUSEA AND VOMITING THAT IS NOT CONTROLLED WITH YOUR NAUSEA MEDICATION  *UNUSUAL SHORTNESS OF BREATH  *UNUSUAL BRUISING OR BLEEDING  TENDERNESS IN MOUTH AND THROAT WITH OR WITHOUT PRESENCE OF ULCERS  *URINARY PROBLEMS  *BOWEL PROBLEMS  UNUSUAL RASH Items with * indicate a potential emergency and should be followed up as soon as possible.  One of the nurses will contact you 24 hours after your treatment. Please let the nurse know about any problems that you may have experienced. Feel free to call the clinic you have any questions or concerns. The clinic phone number is [phone].   I have been informed and understand all the instructions given to me. I know to contact the clinic, my physician, or go to the Emergency Department if any problems should occur. I do not have any questions at this time, but understand that I may call the clinic during office hours   should I have any questions or need assistance in obtaining follow up care.    __________________________________________  _____________  __________ Signature of Patient or Authorized Representative            Date                   Time    __________________________________________ Nurse's Signature    

## 2012-04-21 ENCOUNTER — Other Ambulatory Visit: Payer: Self-pay | Admitting: Oncology

## 2012-04-21 DIAGNOSIS — C55 Malignant neoplasm of uterus, part unspecified: Secondary | ICD-10-CM

## 2012-04-22 ENCOUNTER — Encounter: Payer: Self-pay | Admitting: Radiation Oncology

## 2012-04-22 ENCOUNTER — Telehealth: Payer: Self-pay | Admitting: Oncology

## 2012-04-22 ENCOUNTER — Ambulatory Visit (HOSPITAL_BASED_OUTPATIENT_CLINIC_OR_DEPARTMENT_OTHER): Payer: Medicare Other | Admitting: Oncology

## 2012-04-22 ENCOUNTER — Encounter: Payer: Self-pay | Admitting: Oncology

## 2012-04-22 ENCOUNTER — Ambulatory Visit
Admission: RE | Admit: 2012-04-22 | Discharge: 2012-04-22 | Disposition: A | Discharge status: Home / Self Care | Payer: Medicare Other | Source: Ambulatory Visit | Attending: Radiation Oncology | Admitting: Radiation Oncology

## 2012-04-22 ENCOUNTER — Other Ambulatory Visit (HOSPITAL_BASED_OUTPATIENT_CLINIC_OR_DEPARTMENT_OTHER): Payer: Medicare Other | Admitting: Lab

## 2012-04-22 VITALS — BP 144/88 | HR 91 | Temp 97.9°F | Resp 20 | Ht 63.0 in | Wt 143.8 lb

## 2012-04-22 DIAGNOSIS — C55 Malignant neoplasm of uterus, part unspecified: Secondary | ICD-10-CM

## 2012-04-22 DIAGNOSIS — I4891 Unspecified atrial fibrillation: Secondary | ICD-10-CM

## 2012-04-22 DIAGNOSIS — R350 Frequency of micturition: Secondary | ICD-10-CM

## 2012-04-22 DIAGNOSIS — Z853 Personal history of malignant neoplasm of breast: Secondary | ICD-10-CM

## 2012-04-22 HISTORY — DX: Reserved for concepts with insufficient information to code with codable children: IMO0002

## 2012-04-22 HISTORY — DX: Reserved for inherently not codable concepts without codable children: IMO0001

## 2012-04-22 HISTORY — DX: Personal history of antineoplastic chemotherapy: Z92.21

## 2012-04-22 LAB — COMPREHENSIVE METABOLIC PANEL (CC13)
ALT: 15 U/L (ref 0–55)
AST: 17 U/L (ref 5–34)
Albumin: 3.7 g/dL (ref 3.5–5.0)
Alkaline Phosphatase: 92 U/L (ref 40–150)
Potassium: 4.1 mEq/L (ref 3.5–5.1)
Sodium: 135 mEq/L — ABNORMAL LOW (ref 136–145)
Total Bilirubin: 0.5 mg/dL (ref 0.20–1.20)
Total Protein: 6 g/dL — ABNORMAL LOW (ref 6.4–8.3)

## 2012-04-22 LAB — CBC WITH DIFFERENTIAL/PLATELET
BASO%: 0.2 % (ref 0.0–2.0)
EOS%: 0.1 % (ref 0.0–7.0)
Eosinophils Absolute: 0 10*3/uL (ref 0.0–0.5)
LYMPH%: 14.2 % (ref 14.0–49.7)
MCH: 28.3 pg (ref 25.1–34.0)
MCHC: 33.6 g/dL (ref 31.5–36.0)
MCV: 84.1 fL (ref 79.5–101.0)
MONO%: 14.8 % — ABNORMAL HIGH (ref 0.0–14.0)
NEUT#: 2.3 10*3/uL (ref 1.5–6.5)
Platelets: 191 10*3/uL (ref 145–400)
RBC: 3.8 10*6/uL (ref 3.70–5.45)
RDW: 18.6 % — ABNORMAL HIGH (ref 11.2–14.5)

## 2012-04-22 NOTE — Progress Notes (Signed)
Patient here for routine one month follow up completion of external beam radiation of pelvis as well as intracavitary brachytherapy.Patient denies pain, nausea.Urination and bowels good.Scheduled for one more chemotherapy on 04/30/12 of taxotere and carboolatin.Informed about use of why vaginal dilator needed and how to use.

## 2012-04-22 NOTE — Progress Notes (Signed)
Given size small dilator and lubricant and instructed on inserting three times weekly and leave in for 10 minutes.Instructions on side of dilator box.2 month follow up scheduled.

## 2012-04-22 NOTE — Progress Notes (Signed)
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 Due to significant comorbidities and poor PS in this 78 yo lady, she has had single agent carboplatin as chemotherapy following  first 2 treatments with carboplatin and taxotere. She had cycle 5 on 04-09-12 and is due last planned chemotherapy on 04-30-12. She is not receiving gCSF with this regimen. She saw Dr Roselind Messier today and will see him again in 2 months; she requests to be followed by gyn oncology in [place].  History is of vaginal spotting beginning Dec 2012, with gyn evaluation delayed because of orthopedic problems then. She saw gynecologist in Feb 2013, with endometrial biopsy revealing endometrial cancer, tho specimen was scant. She was seen in consultation by Dr.Paola Duard Brady 09-27-2011 and had surgery by Dr.Soper at [facility] 10-06-2011, which was robotic hysterectomy/BSO/ bilateral pelvic lymphadenectomy (periaortic nodes not visualized). [facility] [phone]) from 10-06-11 showed carcinosarcoma, heterologous type with rhabdomyosarcomatous differentiation, FIGO grade 3, near transmural invasion in anterior and posterior lower uterine segment (less than 1 mm from serosal surface). 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 With high risk for recurrence in upper abdomen and for distant recurrence, Dr Kyla Balzarine recommended taxane + carboplatin given in sandwich fashion with RT; taxotere was chosen in preference to taxol due to preexisting peripheral neuropathy. Cycle 1 Ledell Noss  /taxotere was given on 11-22-11 and cycle 2 with same drugs on 12-13-11. She was hospitalized in [place] after cycle 1 chemotherapy with symptomatic Afib with RVR. Attempts at cardioversion were not successful; she is being followed by cardiology in [place] now, next apt there in Oct. She was initially on xarelto, changed to ASA particularly due to thrombocytopenia with chemotherapy. Performance status by cycle 3 did not allow taxotere, with carboplatin only used. She completed 4500 cGY external beam RT in [place], and HDR x3 in [place] thru 03-26-12. Course has been complicated by poor healing of a foot ulcer and worsening of orthopedic problem with neck.  Patient continues to tolerate the single agent carboplatin much better than the combination treatment. She has not had significant nausea, tho little taste. Bowels have been moving regularly. She has had no bleeding. She is not aware of palpitations and has less shortness of breath than prior to the Afib diagnosis. She has had no fever or other symptoms of infection. She saw Dr Laurian Brim for the neck pain, but did not feel that she could manage PT with the rest of chemotherapy, so no other interventions now. She is using the soft neck collar prn. She has no different pain otherwise. Remainder of 10 point Review of Systems negative.  Objective:  Vital signs in last 24 hours:  BP 144/88  Pulse 91  Temp 97.9 F (36.6 C) (Oral)  Resp 20  Ht 5\' 3"  (1.6 m)  Wt 143 lb 12.8 oz (65.227 kg)  BMI 25.47 kg/m2 Alert, easily ambulatory, mostly keeps neck very still but overall more comfortable today.    HEENT:PERRLA, sclera clear, anicteric and oropharynx clear, no lesions Hair is growing back LymphaticsCervical, supraclavicular, and axillary nodes normal. No inguinal adenopathy Resp: clear to auscultation bilaterally and normal percussion  bilaterally Cardio: irregularly irregular rhythm GI: soft, non-tender; bowel sounds normal; no masses,  no  organomegaly Not distended, surgical incision not remarkable Extremities: extremities normal, atraumatic, no cyanosis or edema Neuro:stable peripheral neuropathy which preceded taxol Skin without rash or ecchymosis   Lab Results:  Results for orders placed in visit on 04/22/12  CBC WITH DIFFERENTIAL      Component Value Range   WBC 3.3 (*) 3.9 - 10.3 10e3/uL   NEUT# 2.3  1.5 - 6.5 10e3/uL   HGB 10.8 (*) 11.6 - 15.9 g/dL   HCT 04.5 (*) 40.9 - 81.1 %   Platelets 191  145 - 400 10e3/uL   MCV 84.1  79.5 - 101.0 fL   MCH 28.3  25.1 - 34.0 pg   MCHC 33.6  31.5 - 36.0 g/dL   RBC 9.14  7.82 - 9.56 10e6/uL   RDW 18.6 (*) 11.2 - 14.5 %   lymph# 0.5 (*) 0.9 - 3.3 10e3/uL   MONO# 0.5  0.1 - 0.9 10e3/uL   Eosinophils Absolute 0.0  0.0 - 0.5 10e3/uL   Basophils Absolute 0.0  0.0 - 0.1 10e3/uL   NEUT% 70.7  38.4 - 76.8 %   LYMPH% 14.2  14.0 - 49.7 %   MONO% 14.8 (*) 0.0 - 14.0 %   EOS% 0.1  0.0 - 7.0 %   BASO% 0.2  0.0 - 2.0 %   CMET available after visit normal with exception of Na 135, glu 126, T prot 6.0  Studies/Results:  No results found.  Medications: I have reviewed the patient's current medications. Patient plans to get flu shot from PCP this month  Assessment/Plan: 1.Uterine carcinosarcoma: history as above. Will complete planned chemotherapy on 04-30-12. I will see her back with recheck labs in mid Nov, then she will have CT AP in early Dec prior to follow up with Dr Roselind Messier. I expect she will see gyn oncology after Dr Trina Ao visit. 2.atrial fibrillation: tolerating this problem and medications better now. 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 8. Urinary frequency particularly bothersome at hs. Has never seen urologist, which I hope she will consider after this treatment is completed  Patient was comfortable with discussion and plan.        Tyller Bowlby P, MD   04/22/2012, 3:19 PM

## 2012-04-22 NOTE — Telephone Encounter (Signed)
gv and printed appt schedule for Oct thru Dec to pt.  Called and left a msg for Selena Batten to make an appointment with Dr. Nelly Rout in Jan.

## 2012-04-22 NOTE — Progress Notes (Signed)
Radiation Oncology         [phone]) [phone] ________________________________  Name: Kaylee Pugh MRN: [id number]  Date: 04/22/2012  DOB: 13-Jul-1924  Follow-Up Visit Note  CC: Kaylee Sages, MD  Kaylee Sages, MD  Diagnosis:   Stage IIIC uterine carcinosarcoma  Interval Since Last Radiation:  One month, the patient received external beam radiation therapy in [place]. She completed intracavitary brachytherapy treatments hear in [place].   Narrative:  The patient returns today for routine follow-up.  She seems to be doing quite well at this time. She has minimal fatigue. She denies any further problems with diarrhea. she denies any nausea,  bowel or bladder complaints. She denies any vaginal bleeding.   she has one more chemotherapy treatment remaining to complete her therapy.                             ALLERGIES:  is allergic to iodine.  Meds: Current Outpatient Prescriptions  Medication Sig Dispense Refill  . acetaminophen (TYLENOL) 500 MG tablet Take 500 mg by mouth every 6 (six) hours as needed. Pain      . ALPRAZolam (XANAX) 0.25 MG tablet Take 0.25 mg by mouth at bedtime as needed.       Marland Kitchen aspirin 81 MG tablet Take 81 mg by mouth daily.      . Calcium Carbonate-Vitamin D (CALCIUM + D) 600-200 MG-UNIT TABS Take 2 tablets by mouth at bedtime.       Marland Kitchen diltiazem (CARDIZEM CD) 180 MG 24 hr capsule Take 180 mg by mouth daily.      Marland Kitchen esomeprazole (NEXIUM) 40 MG capsule Take 40 mg by mouth daily before breakfast.      . HYDROcodone-acetaminophen (VICODIN) 5-500 MG per tablet Take 1 tablet by mouth every 6 (six) hours as needed.      Marland Kitchen levothyroxine (SYNTHROID, LEVOTHROID) 50 MCG tablet Take 50 mcg by mouth daily.      . ondansetron (ZOFRAN) 4 MG tablet Take 4 mg by mouth every 8 (eight) hours as needed. nausea      . rosuvastatin (CRESTOR) 5 MG tablet Take 5 mg by mouth daily.      Marland Kitchen dexamethasone (DECADRON) 4 MG tablet Pt takes 2 tabs twice a day x 3 days. Begin  the day prior to taxotere  chemotherapy.  12 tablet  1  . PRESCRIPTION MEDICATION [facility]. Pt's last treatment was on 11-22-11 pt's on every 3 week cycle. Next treatment is  due June 5. Followed by Dr Darrold Span        Physical Findings: The patient is in no acute distress. Patient is alert and oriented.  vitals were not taken for this visit.Marland Kitchen  No palpable supraclavicular or axillary adenopathy. Lungs are clear to auscultation. The heart has regular rhythm and rate. Abdomen is soft and nontender with normal bowel sounds.  Lab Findings: Lab Results  Component Value Date   WBC 3.3* 04/22/2012   HGB 10.8* 04/22/2012   HCT 32.0* 04/22/2012   MCV 84.1 04/22/2012   PLT 191 04/22/2012    @LASTCHEM @  Radiographic Findings: No results found.  Impression:  The patient is recovering from the effects of radiation.  She will continue with one more cycle of chemotherapy to complete her adjuvant treatment.   Plan:  Routine followup in 2 months for pelvic exam and Pap smear. I  am unsure whether the patient will be following up with Dr. Kyla Balzarine in [facility]  or one of his colleagues here in [place].  Today the patient was given a vaginal dilator and instructions on its use in light of her pelvic radiation therapy.  _____________________________________    Billie Lade, PhD, MD

## 2012-04-22 NOTE — Patient Instructions (Signed)
Appointments as scheduled. 

## 2012-04-25 ENCOUNTER — Ambulatory Visit: Payer: Medicare Other | Admitting: Radiation Oncology

## 2012-04-28 ENCOUNTER — Other Ambulatory Visit: Payer: Self-pay | Admitting: Oncology

## 2012-04-30 ENCOUNTER — Other Ambulatory Visit (HOSPITAL_BASED_OUTPATIENT_CLINIC_OR_DEPARTMENT_OTHER): Payer: Medicare Other | Admitting: Lab

## 2012-04-30 ENCOUNTER — Telehealth: Payer: Self-pay

## 2012-04-30 ENCOUNTER — Encounter: Payer: Self-pay | Admitting: Pharmacist

## 2012-04-30 ENCOUNTER — Ambulatory Visit (HOSPITAL_BASED_OUTPATIENT_CLINIC_OR_DEPARTMENT_OTHER): Payer: Medicare Other

## 2012-04-30 VITALS — BP 134/89 | HR 72 | Temp 97.6°F | Resp 17

## 2012-04-30 DIAGNOSIS — C55 Malignant neoplasm of uterus, part unspecified: Secondary | ICD-10-CM

## 2012-04-30 DIAGNOSIS — Z5111 Encounter for antineoplastic chemotherapy: Secondary | ICD-10-CM

## 2012-04-30 LAB — CBC WITH DIFFERENTIAL/PLATELET
EOS%: 0.2 % (ref 0.0–7.0)
Eosinophils Absolute: 0 10*3/uL (ref 0.0–0.5)
LYMPH%: 16.3 % (ref 14.0–49.7)
MCH: 27.4 pg (ref 25.1–34.0)
MCV: 83.5 fL (ref 79.5–101.0)
MONO%: 18.6 % — ABNORMAL HIGH (ref 0.0–14.0)
NEUT#: 2.8 10*3/uL (ref 1.5–6.5)
Platelets: 115 10*3/uL — ABNORMAL LOW (ref 145–400)
RBC: 3.76 10*6/uL (ref 3.70–5.45)
RDW: 16.9 % — ABNORMAL HIGH (ref 11.2–14.5)
nRBC: 0 % (ref 0–0)

## 2012-04-30 MED ORDER — SODIUM CHLORIDE 0.9 % IV SOLN
Freq: Once | INTRAVENOUS | Status: AC
  Administered 2012-04-30: 12:00:00 via INTRAVENOUS

## 2012-04-30 MED ORDER — SODIUM CHLORIDE 0.9 % IV SOLN
393.0000 mg | Freq: Once | INTRAVENOUS | Status: AC
  Administered 2012-04-30: 390 mg via INTRAVENOUS
  Filled 2012-04-30: qty 39

## 2012-04-30 MED ORDER — DEXAMETHASONE SODIUM PHOSPHATE 4 MG/ML IJ SOLN
20.0000 mg | Freq: Once | INTRAMUSCULAR | Status: AC
  Administered 2012-04-30: 20 mg via INTRAVENOUS

## 2012-04-30 MED ORDER — ONDANSETRON 16 MG/50ML IVPB (CHCC)
16.0000 mg | Freq: Once | INTRAVENOUS | Status: AC
  Administered 2012-04-30: 16 mg via INTRAVENOUS

## 2012-04-30 NOTE — Patient Instructions (Signed)
[facility] Discharge Instructions for Patients Receiving Chemotherapy  Today you received the following chemotherapy agent Carboplatin.  To help prevent nausea and vomiting after your treatment, we encourage you to take your nausea medication. Begin taking your nausea medication as often as prescribed for by Dr. Livesay.    If you develop nausea and vomiting that is not controlled by your nausea medication, call the clinic. If it is after clinic hours your family physician or the after hours number for the clinic or go to the Emergency Department.   BELOW ARE SYMPTOMS THAT SHOULD BE REPORTED IMMEDIATELY:  *FEVER GREATER THAN 100.5 F  *CHILLS WITH OR WITHOUT FEVER  NAUSEA AND VOMITING THAT IS NOT CONTROLLED WITH YOUR NAUSEA MEDICATION  *UNUSUAL SHORTNESS OF BREATH  *UNUSUAL BRUISING OR BLEEDING  TENDERNESS IN MOUTH AND THROAT WITH OR WITHOUT PRESENCE OF ULCERS  *URINARY PROBLEMS  *BOWEL PROBLEMS  UNUSUAL RASH Items with * indicate a potential emergency and should be followed up as soon as possible.  One of the nurses will contact you 24 hours after your treatment. Please let the nurse know about any problems that you may have experienced. Feel free to call the clinic you have any questions or concerns. The clinic phone number is [phone].   I have been informed and understand all the instructions given to me. I know to contact the clinic, my physician, or go to the Emergency Department if any problems should occur. I do not have any questions at this time, but understand that I may call the clinic during office hours   should I have any questions or need assistance in obtaining follow up care.    __________________________________________  _____________  __________ Signature of Patient or Authorized Representative            Date                   Time    __________________________________________ Nurse's Signature    

## 2012-04-30 NOTE — Telephone Encounter (Signed)
Gave Kaylee Pugh signed form dated 04-30-12 approving message therapy.  Sent a copy to HIM to be scanned into pt.'s EMR.

## 2012-05-28 ENCOUNTER — Other Ambulatory Visit (HOSPITAL_BASED_OUTPATIENT_CLINIC_OR_DEPARTMENT_OTHER): Payer: Medicare Other | Admitting: Lab

## 2012-05-28 ENCOUNTER — Telehealth: Payer: Self-pay | Admitting: Oncology

## 2012-05-28 ENCOUNTER — Ambulatory Visit (HOSPITAL_BASED_OUTPATIENT_CLINIC_OR_DEPARTMENT_OTHER): Payer: Medicare Other | Admitting: Oncology

## 2012-05-28 ENCOUNTER — Encounter: Payer: Self-pay | Admitting: Oncology

## 2012-05-28 ENCOUNTER — Other Ambulatory Visit: Payer: Self-pay

## 2012-05-28 VITALS — BP 134/81 | HR 75 | Temp 97.5°F | Resp 20 | Wt 145.8 lb

## 2012-05-28 DIAGNOSIS — C55 Malignant neoplasm of uterus, part unspecified: Secondary | ICD-10-CM

## 2012-05-28 DIAGNOSIS — R350 Frequency of micturition: Secondary | ICD-10-CM

## 2012-05-28 DIAGNOSIS — I4891 Unspecified atrial fibrillation: Secondary | ICD-10-CM

## 2012-05-28 DIAGNOSIS — I10 Essential (primary) hypertension: Secondary | ICD-10-CM

## 2012-05-28 LAB — CBC WITH DIFFERENTIAL/PLATELET
BASO%: 0.2 % (ref 0.0–2.0)
LYMPH%: 21.8 % (ref 14.0–49.7)
MCH: 30.7 pg (ref 25.1–34.0)
MCHC: 34.4 g/dL (ref 31.5–36.0)
MCV: 89.2 fL (ref 79.5–101.0)
MONO%: 21.5 % — ABNORMAL HIGH (ref 0.0–14.0)
NEUT%: 56.1 % (ref 38.4–76.8)
Platelets: 198 10*3/uL (ref 145–400)
RBC: 3.39 10*6/uL — ABNORMAL LOW (ref 3.70–5.45)
WBC: 1.9 10*3/uL — ABNORMAL LOW (ref 3.9–10.3)

## 2012-05-28 LAB — COMPREHENSIVE METABOLIC PANEL (CC13)
ALT: 10 U/L (ref 0–55)
Alkaline Phosphatase: 84 U/L (ref 40–150)
Creatinine: 0.8 mg/dL (ref 0.6–1.1)
Sodium: 135 mEq/L — ABNORMAL LOW (ref 136–145)
Total Bilirubin: 0.7 mg/dL (ref 0.20–1.20)
Total Protein: 6.1 g/dL — ABNORMAL LOW (ref 6.4–8.3)

## 2012-05-28 MED ORDER — B COMPLEX-VITAMIN B12 PO TABS: 1.0000 | ORAL_TABLET | Freq: Every day | ORAL | Status: AC

## 2012-05-28 NOTE — Telephone Encounter (Signed)
APPTS MADE AND PRINTED FOR PT AOM °

## 2012-05-28 NOTE — Progress Notes (Signed)
OFFICE PROGRESS NOTE   05/28/2012   Physicians:J.Soper/ W.Brewster, J.Kinard, M.Mahoney (PCP, [name]), Phillip Heal (cardiology, [name]), O'Neal (GI, [name]), O'Toole (pain clinic [facility])   INTERVAL HISTORY:  Patient is seen, alone for visit initially (then son arrived towards end of visit), in scheduled follow up of her IIIC uterine carcinosarcoma, now having completed chemotherapy and radiation following initial surgery. She is to have restaging CT in early Dec and will see Dr Roselind Messier on 06-20-12; she wants to continue gyn oncology follow up in [facility], so that I have set her up to see Dr Nelly Rout again in Jan 2014.  History is of vaginal spotting beginning Dec 2012, with gyn evaluation delayed because of orthopedic problems then. She saw gynecologist in Feb 2013, with endometrial biopsy revealing endometrial cancer, tho specimen was scant. She was seen in consultation by Dr.Paola Duard Brady 09-27-2011 and had surgery by Dr[facility]Soper at [facility] 10-06-2011, which was robotic hysterectomy/BSO/ bilateral pelvic lymphadenectomy (periaortic nodes not visualized). [facility] [phone]) from 10-06-11 showed carcinosarcoma, heterologous type with rhabdomyosarcomatous differentiation, FIGO grade 3, near transmural invasion in anterior and posterior lower uterine segment (less than 1 mm from serosal surface). 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 With high risk for recurrence in upper abdomen and for distant recurrence, Dr Kyla Balzarine recommended taxane + carboplatin given in sandwich fashion with RT; taxotere was chosen in preference to taxol due to preexisting peripheral neuropathy. Cycle 1 Ledell Noss /taxotere was given on 11-22-11 and cycle 2 with same drugs on 12-13-11. She was hospitalized in [place] after cycle 1 chemotherapy with  symptomatic Afib with RVR. Attempts at cardioversion were not successful; she is being followed by cardiology in [place] now. With the Afib, she was initially on xarelto, changed to ASA particularly due to thrombocytopenia with chemotherapy. Performance status and peripheral neuropathy by cycle 3 did not allow taxotere, with carboplatin only used for remainder of chemotherapy. She was treated with the chemotherapy in sandwich fashion with RT, completing 4500 cGY external beam RT in [place] then HDR x3 in [place] thru 03-26-12. She had 3 additional cycles of carboplatin thru 04-30-12, even the single agent carboplatin difficult for her to tolerate. She saw Dr Nelly Rout last for gyn oncology, in July 2013.   Patient tells me that she has little energy and some shortness of breath with exertion such as walking length of hall, no cough and no chest pain. She has no new or different pain. She had antibiotics recently from PCP for general aching that seemed flu-like. She has had no nausea, appetite good. She has had slight swelling in LE persistent, no changes in cardiac meds at recent visit to Dr Nedra Hai. Bowels move generally daily and she would like to try probiotics. She is having more neck discomfort, cannot put her head back to gargle, is to see Dr Laurian Brim next in ~ Jan.Numbness in hands persists, more bothersome at night. Remainder of 10 point Review of Systems negative.  Objective:  Vital signs in last 24 hours:  BP 134/81  Pulse 75  Temp 97.5 F (36.4 C) (Oral)  Resp 20  Wt 145 lb 12.8 oz (66.134 kg) Weight is up 2 lbs. Ambulatory without assistance. Respirations not labored with activity in exam room. Looks tired but NAD. Does not have complete alopecia.    HEENT:PERRLA, sclera clear, anicteric and oropharynx clear, no lesions LymphaticsCervical,  supraclavicular, and axillary nodes normal.No inguinal adenopathy Resp: clear to auscultation bilaterally and normal percussion bilaterally Cardio:  irregularly irregular rhythm GI: soft, not distended, not tender. No clear HSM or mass. Diminished bowel sounds. Not tender epigastrium Extremities: trace - 1+ pedal edema bilaterally without cords or tenderness Neuro:decreased sensation to light touch hands bilaterally No central catheter Skin without rash or ecchymosis   Lab Results: Labs received from PCP dated 05-06-12 scanned into this EMR: TSH 3.23, plt 86K, Hgb 10.5, (no WBC on these papers), Na 127, K 5.9, creat 0.6 Results for orders placed in visit on 05/28/12  CBC WITH DIFFERENTIAL      Component Value Range   WBC 1.9 (*) 3.9 - 10.3 10e3/uL   NEUT# 1.1 (*) 1.5 - 6.5 10e3/uL   HGB 10.4 (*) 11.6 - 15.9 g/dL   HCT 40.9 (*) 81.1 - 91.4 %   Platelets 198  145 - 400 10e3/uL   MCV 89.2  79.5 - 101.0 fL   MCH 30.7  25.1 - 34.0 pg   MCHC 34.4  31.5 - 36.0 g/dL   RBC 7.82 (*) 9.56 - 2.13 10e6/uL   RDW 22.1 (*) 11.2 - 14.5 %   lymph# 0.4 (*) 0.9 - 3.3 10e3/uL   MONO# 0.4  0.1 - 0.9 10e3/uL   Eosinophils Absolute 0.0  0.0 - 0.5 10e3/uL   Basophils Absolute 0.0  0.0 - 0.1 10e3/uL   NEUT% 56.1  38.4 - 76.8 %   LYMPH% 21.8  14.0 - 49.7 %   MONO% 21.5 (*) 0.0 - 14.0 %   EOS% 0.4  0.0 - 7.0 %   BASO% 0.2  0.0 - 2.0 %  COMPREHENSIVE METABOLIC PANEL (CC13)      Component Value Range   Sodium 135 (*) 136 - 145 mEq/L   Potassium 3.7  3.5 - 5.1 mEq/L   Chloride 102  98 - 107 mEq/L   CO2 24  22 - 29 mEq/L   Glucose 120 (*) 70 - 99 mg/dl   BUN 08.6  7.0 - 57.8 mg/dL   Creatinine 0.8  0.6 - 1.1 mg/dL   Total Bilirubin 4.69  0.20 - 1.20 mg/dL   Alkaline Phosphatase 84  40 - 150 U/L   AST 18  5 - 34 U/L   ALT 10  0 - 55 U/L   Total Protein 6.1 (*) 6.4 - 8.3 g/dL   Albumin 3.6  3.5 - 5.0 g/dL   Calcium 9.4  8.4 - 62.9 mg/dL     Studies/Results: CT AP ordered for 06-13-12 in [place]  Medications: I have reviewed the patient's current medications. She has had flu vaccine  Assessment/Plan: 1.Uterine carcinosarcoma: history as  above. She is due restaging CT upcoming, which will be shortly prior to Dr Trina Ao visit; she will have repeat CBC here day of Dr Trina Ao visit as counts are still lower than baseline since last chemotherapy. I have requested appointment back to gyn oncology in ~ Jan and I will see her again after that.  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  8. Urinary frequency particularly bothersome at  hs. Has never seen urologist  Patient and son understand and agree with plan above.  Jeanae Whitmill P, MD   05/28/2012, 1:43 PM

## 2012-05-28 NOTE — Patient Instructions (Addendum)
Platelets are back up to normal level since last chemo on 04-30-12; your white blood cells are a little low still tho not neutropenic and your hemoglobin is steady tho still a bit anemic, 10.4  We will recheck blood counts when you come to see Dr Roselind Messier in Dec.  Dr Laurette Schimke in gyn oncology (Dr Calton Dach partner, whom you saw in July) will see you Jul 30, 2012 at 1245.

## 2012-06-11 ENCOUNTER — Telehealth: Payer: Self-pay | Admitting: *Deleted

## 2012-06-11 NOTE — Telephone Encounter (Signed)
Pt's son called to let us know that patient has been having cough/congestion for this past week. Saw PA at PCP 06/10/12, had breathing treatment and is to return to PCP for follow up 06/12/12. Scheduled for CT 06/13/12. Pt is concerned she will not be able to drink contrast. Asked son to call us after pt sees PCP tomorrow.

## 2012-06-13 ENCOUNTER — Ambulatory Visit (HOSPITAL_COMMUNITY)
Admission: RE | Admit: 2012-06-13 | Discharge: 2012-06-13 | Payer: Medicare Other | Source: Ambulatory Visit | Attending: Oncology | Admitting: Oncology

## 2012-06-13 ENCOUNTER — Telehealth: Payer: Self-pay | Admitting: *Deleted

## 2012-06-13 ENCOUNTER — Other Ambulatory Visit: Payer: Medicare Other | Admitting: Lab

## 2012-06-13 NOTE — Telephone Encounter (Signed)
Vandy from CT called to say patient has labs scheduled for 12/10 before CT and again 12/12 before appointment with Dr Roselind Messier. She states that CT does not need labs prior to 12/10 CT, able to use labs from 11/19.  Do you want to cancel either of these lab appointments?

## 2012-06-14 ENCOUNTER — Other Ambulatory Visit: Payer: Self-pay | Admitting: *Deleted

## 2012-06-14 ENCOUNTER — Other Ambulatory Visit: Payer: Self-pay | Admitting: Oncology

## 2012-06-14 ENCOUNTER — Telehealth: Payer: Self-pay | Admitting: *Deleted

## 2012-06-14 NOTE — Telephone Encounter (Signed)
Spoke with patient's son regarding lab appointment. Cancelled lab appointment for 12/10.  Confirmed CT is 12/10. Lab and Dr Sharol Roussel 12/12

## 2012-06-18 ENCOUNTER — Telehealth: Payer: Self-pay

## 2012-06-18 ENCOUNTER — Ambulatory Visit (HOSPITAL_COMMUNITY)
Admission: RE | Admit: 2012-06-18 | Discharge: 2012-06-18 | Disposition: A | Discharge status: Home / Self Care | Payer: Medicare Other | Source: Ambulatory Visit | Attending: Oncology | Admitting: Oncology

## 2012-06-18 ENCOUNTER — Other Ambulatory Visit: Payer: Medicare Other | Admitting: Lab

## 2012-06-18 ENCOUNTER — Telehealth: Payer: Self-pay | Admitting: *Deleted

## 2012-06-18 DIAGNOSIS — C55 Malignant neoplasm of uterus, part unspecified: Secondary | ICD-10-CM

## 2012-06-18 NOTE — Telephone Encounter (Signed)
Mr. Kaylee Pugh wanted to be sure that the Ct scan being done tomorrow at 0930 would allow enough time for Dr. Roselind Messier to have the results at appt. 06-20-12.  Told him that Dr. Roselind Messier should have results by 1030 appointment on 06-20-12.

## 2012-06-18 NOTE — Telephone Encounter (Signed)
Patient in CT department at this time.  Kim called reporting patient has an allergy to iodine and has not been pre-medicated.  Verbal order received and read back from Dr. Darrold Span that patient needs to be pre-medicated, not to proceed with today's scan but reschedule.  Selena Batten will notify patient.

## 2012-06-19 ENCOUNTER — Ambulatory Visit (HOSPITAL_COMMUNITY)
Admission: RE | Admit: 2012-06-19 | Discharge: 2012-06-19 | Disposition: A | Discharge status: Home / Self Care | Payer: Medicare Other | Source: Ambulatory Visit | Attending: Oncology | Admitting: Oncology

## 2012-06-19 DIAGNOSIS — C55 Malignant neoplasm of uterus, part unspecified: Secondary | ICD-10-CM | POA: Insufficient documentation

## 2012-06-19 DIAGNOSIS — Z90710 Acquired absence of both cervix and uterus: Secondary | ICD-10-CM | POA: Insufficient documentation

## 2012-06-19 DIAGNOSIS — K8020 Calculus of gallbladder without cholecystitis without obstruction: Secondary | ICD-10-CM | POA: Insufficient documentation

## 2012-06-19 MED ORDER — IOHEXOL 300 MG/ML  SOLN
100.0000 mL | Freq: Once | INTRAMUSCULAR | Status: AC | PRN
  Administered 2012-06-19: 100 mL via INTRAVENOUS

## 2012-06-20 ENCOUNTER — Other Ambulatory Visit (HOSPITAL_COMMUNITY)
Admission: RE | Admit: 2012-06-20 | Discharge: 2012-06-20 | Disposition: A | Discharge status: Home / Self Care | Payer: Medicare Other | Source: Ambulatory Visit | Attending: Radiation Oncology | Admitting: Radiation Oncology

## 2012-06-20 ENCOUNTER — Ambulatory Visit
Admission: RE | Admit: 2012-06-20 | Discharge: 2012-06-20 | Disposition: A | Discharge status: Home / Self Care | Payer: Medicare Other | Source: Ambulatory Visit | Attending: Radiation Oncology | Admitting: Radiation Oncology

## 2012-06-20 ENCOUNTER — Other Ambulatory Visit (HOSPITAL_COMMUNITY): Payer: Medicare Other

## 2012-06-20 ENCOUNTER — Encounter: Payer: Self-pay | Admitting: Radiation Oncology

## 2012-06-20 ENCOUNTER — Other Ambulatory Visit: Payer: Self-pay | Admitting: Radiation Oncology

## 2012-06-20 ENCOUNTER — Other Ambulatory Visit (HOSPITAL_BASED_OUTPATIENT_CLINIC_OR_DEPARTMENT_OTHER): Payer: Medicare Other | Admitting: Lab

## 2012-06-20 VITALS — BP 132/72 | HR 93 | Temp 97.7°F | Wt 143.1 lb

## 2012-06-20 DIAGNOSIS — C55 Malignant neoplasm of uterus, part unspecified: Secondary | ICD-10-CM

## 2012-06-20 DIAGNOSIS — Z124 Encounter for screening for malignant neoplasm of cervix: Secondary | ICD-10-CM | POA: Insufficient documentation

## 2012-06-20 LAB — COMPREHENSIVE METABOLIC PANEL (CC13)
ALT: 21 U/L (ref 0–55)
AST: 21 U/L (ref 5–34)
Chloride: 99 mEq/L (ref 98–107)
Creatinine: 0.9 mg/dL (ref 0.6–1.1)
Sodium: 134 mEq/L — ABNORMAL LOW (ref 136–145)
Total Bilirubin: 0.49 mg/dL (ref 0.20–1.20)
Total Protein: 6.3 g/dL — ABNORMAL LOW (ref 6.4–8.3)

## 2012-06-20 LAB — CBC WITH DIFFERENTIAL/PLATELET
BASO%: 0.1 % (ref 0.0–2.0)
EOS%: 0.3 % (ref 0.0–7.0)
HCT: 31.2 % — ABNORMAL LOW (ref 34.8–46.6)
LYMPH%: 9.1 % — ABNORMAL LOW (ref 14.0–49.7)
MCH: 29.1 pg (ref 25.1–34.0)
MCHC: 33.5 g/dL (ref 31.5–36.0)
MONO#: 0.9 10*3/uL (ref 0.1–0.9)
NEUT%: 77.1 % — ABNORMAL HIGH (ref 38.4–76.8)
RBC: 3.59 10*6/uL — ABNORMAL LOW (ref 3.70–5.45)
WBC: 6.6 10*3/uL (ref 3.9–10.3)
lymph#: 0.6 10*3/uL — ABNORMAL LOW (ref 0.9–3.3)

## 2012-06-20 NOTE — Progress Notes (Signed)
Radiation Oncology         [phone]) [phone] ________________________________  Name: Kaylee Pugh MRN: [id number]  Date: 06/20/2012  DOB: 27-Feb-1925  Follow-Up Visit Note  CC: Marrian Salvage, PA  Reece Packer, MD  Diagnosis:   Stage III-C uterine carcinosarcoma  Interval Since Last Radiation:  3 months  Narrative:  The patient returns today for routine follow-up.  She completed adjuvant chemotherapy as well as radiation treatments. Patient completed her external beam radiation therapy in [place]. She underwent intracavitary brachii therapy treatments hear in [place].  She denies any problems with diarrhea or rectal bleeding. She actually has chronic problems with constipation and is about some discomfort in the lower, area related to this issue. She denies any hematuria or vaginal bleeding. She does have a vaginal dilator and is using this as recommended.   She has recently completed a course of antibiotic therapy for upper respiratory illness. She does complain of chronic poor taste.                      ALLERGIES:  is allergic to iodine.  Meds: Current Outpatient Prescriptions  Medication Sig Dispense Refill  . acetaminophen (TYLENOL) 500 MG tablet Take 500 mg by mouth every 6 (six) hours as needed. Pain      . ALPRAZolam (XANAX) 0.25 MG tablet Take 0.25 mg by mouth at bedtime as needed.       Marland Kitchen aspirin 81 MG tablet Take 81 mg by mouth daily.      . B Complex-Folic Acid (B COMPLEX-VITAMIN B12) TABS Take 1 tablet by mouth daily.  100 tablet  prn  . Calcium Carbonate-Vitamin D (CALCIUM + D) 600-200 MG-UNIT TABS Take 2 tablets by mouth at bedtime.       Marland Kitchen diltiazem (CARDIZEM CD) 180 MG 24 hr capsule Take 180 mg by mouth daily.      Marland Kitchen docusate sodium (COLACE) 100 MG capsule Take 100 mg by mouth daily as needed.      Marland Kitchen HYDROcodone-acetaminophen (VICODIN) 5-500 MG per tablet Take 1 tablet by mouth every 6 (six) hours as needed.      Marland Kitchen levothyroxine (SYNTHROID, LEVOTHROID) 50 MCG tablet  Take 50 mcg by mouth daily.      Marland Kitchen omeprazole (PRILOSEC) 20 MG capsule Take 20 mg by mouth Daily.      . ondansetron (ZOFRAN) 8 MG tablet       . rosuvastatin (CRESTOR) 5 MG tablet Take 5 mg by mouth daily.        Physical Findings: The patient is in no acute distress. Patient is alert and oriented.  weight is 143 lb 1.6 oz (64.91 kg). Her temperature is 97.7 F (36.5 C). Her blood pressure is 132/72 and her pulse is 93. Her oxygen saturation is 100%. .  No palpable supraclavicular or axillary adenopathy. The lungs are clear to auscultation. The heart has a regular rhythm and rate. The abdomen is soft and nontender with normal bowel sounds. There is no inguinal adenopathy appreciated. On pelvic examination the external genitalia are unremarkable. A speculum exam is performed. There are no mucosal lesions noted in the vaginal vault. A Pap smear was obtained of the proximal vagina. On bimanual and rectovaginal examination there no pelvic masses appreciated.  The oral cavity is free of any secondary infection. The mucosa is moist.  Lab Findings: Lab Results  Component Value Date   WBC 6.6 06/20/2012   HGB 10.5* 06/20/2012   HCT 31.2* 06/20/2012   MCV  86.9 06/20/2012   PLT 277 06/20/2012      Radiographic Findings: Ct Abdomen Pelvis W Contrast  06/19/2012  *RADIOLOGY REPORT*  Clinical Data: Uterine carcinoma status post surgery and radiotherapy and chemotherapy.  Low back pain.  CT ABDOMEN AND PELVIS WITH CONTRAST  Technique:  Multidetector CT imaging of the abdomen and pelvis was performed following the standard protocol during bolus administration of intravenous contrast.  Contrast: OMNIPAQUE IOHEXOL 300 MG/ML  SOLN the patient received 13 our prior prep contrast allergy.  No adverse event.  Comparison: CT 01/25/2012  Findings: Lung bases are clear.  No pericardial fluid.  No focal hepatic lesion.  No nodularity of the liver.  No peritoneal disease.  Multiple gallstones in the  gallbladder.  Pancreas, spleen, adrenal glands, and kidneys are normal.  There is some stasis of the enteric contents of the distal small bowel.  No evidence of obstruction.  The distal colon is collapsed.  Abdominal aorta normal caliber.  No retroperitoneal periportal lymphadenopathy.  No free fluid the pelvis.  Post hysterectomy anatomy.   There is no pelvic lymphadenopathy.  Bladder is normal.  Review of the bone windows demonstrates no aggressive osseous lesions.  IMPRESSION:  1.  No evidence of uterine carcinoma recurrence in the abdomen or pelvis. 2.  Stasis of enteric contents of the distal small bowel.   Original Report Authenticated By: Genevive Bi, M.D.     Impression:  The patient is recovering from the effects of radiation.  No evidence of recurrence on clinical exam and recent CT scans, Pap smear pending.  Patient will be seen in gynecologic oncology in 3 months.   Plan:  Routine followup in radiation oncology in 6 months.  _____________________________________    Billie Lade, PhD, MD

## 2012-06-20 NOTE — Patient Instructions (Signed)
Return for routine followup in radiation oncology in 6 months  Call for signs of vaginal bleeding rectal bleeding or blood in urine

## 2012-06-20 NOTE — Progress Notes (Signed)
Patient here for routine follow up uterine cancer radiation completed September 2013.DEnies pain except abdominal soreness.To review ct scan results which is negative for uterine recurrence.Getting over pneumonitis and completed anti biotic therapy  On yesterday.

## 2012-06-28 ENCOUNTER — Telehealth: Payer: Self-pay

## 2012-06-28 NOTE — Telephone Encounter (Signed)
Patient and son Kaylee Pugh made aware of pap smear results from 12/13. Negative for intraepithelial cells or malignancy.

## 2012-07-16 ENCOUNTER — Ambulatory Visit: Payer: Medicare Other | Admitting: Gynecologic Oncology

## 2012-07-30 ENCOUNTER — Ambulatory Visit: Payer: Medicare Other | Admitting: Gynecologic Oncology

## 2012-08-21 ENCOUNTER — Telehealth: Payer: Self-pay | Admitting: Oncology

## 2012-08-23 ENCOUNTER — Ambulatory Visit: Payer: Medicare Other | Admitting: Oncology

## 2012-08-28 ENCOUNTER — Other Ambulatory Visit: Payer: Self-pay

## 2012-08-28 ENCOUNTER — Encounter: Payer: Self-pay | Admitting: Oncology

## 2012-08-28 ENCOUNTER — Ambulatory Visit (HOSPITAL_BASED_OUTPATIENT_CLINIC_OR_DEPARTMENT_OTHER): Payer: Medicare Other | Admitting: Oncology

## 2012-08-28 ENCOUNTER — Ambulatory Visit: Payer: Medicare Other | Admitting: Lab

## 2012-08-28 ENCOUNTER — Telehealth: Payer: Self-pay | Admitting: Oncology

## 2012-08-28 VITALS — BP 156/76 | HR 112 | Temp 96.9°F | Resp 20 | Ht 63.0 in | Wt 151.1 lb

## 2012-08-28 DIAGNOSIS — R351 Nocturia: Secondary | ICD-10-CM

## 2012-08-28 DIAGNOSIS — I4891 Unspecified atrial fibrillation: Secondary | ICD-10-CM

## 2012-08-28 DIAGNOSIS — C55 Malignant neoplasm of uterus, part unspecified: Secondary | ICD-10-CM

## 2012-08-28 DIAGNOSIS — D649 Anemia, unspecified: Secondary | ICD-10-CM

## 2012-08-28 DIAGNOSIS — R350 Frequency of micturition: Secondary | ICD-10-CM

## 2012-08-28 LAB — CBC WITH DIFFERENTIAL/PLATELET
BASO%: 0 % (ref 0.0–2.0)
Basophils Absolute: 0 10*3/uL (ref 0.0–0.1)
EOS%: 0 % (ref 0.0–7.0)
Eosinophils Absolute: 0 10*3/uL (ref 0.0–0.5)
HCT: 32.7 % — ABNORMAL LOW (ref 34.8–46.6)
HGB: 10.6 g/dL — ABNORMAL LOW (ref 11.6–15.9)
LYMPH%: 2.5 % — ABNORMAL LOW (ref 14.0–49.7)
MCH: 26.2 pg (ref 25.1–34.0)
MCHC: 32.5 g/dL (ref 31.5–36.0)
MCV: 80.6 fL (ref 79.5–101.0)
MONO#: 0.7 10*3/uL (ref 0.1–0.9)
MONO%: 6.2 % (ref 0.0–14.0)
NEUT#: 9.7 10*3/uL — ABNORMAL HIGH (ref 1.5–6.5)
NEUT%: 91.3 % — ABNORMAL HIGH (ref 38.4–76.8)
Platelets: 201 10*3/uL (ref 145–400)
RBC: 4.06 10*6/uL (ref 3.70–5.45)
RDW: 16.9 % — ABNORMAL HIGH (ref 11.2–14.5)
WBC: 10.6 10*3/uL — ABNORMAL HIGH (ref 3.9–10.3)
lymph#: 0.3 10*3/uL — ABNORMAL LOW (ref 0.9–3.3)

## 2012-08-28 LAB — COMPREHENSIVE METABOLIC PANEL (CC13)
ALT: 19 U/L (ref 0–55)
AST: 19 U/L (ref 5–34)
Albumin: 3.9 g/dL (ref 3.5–5.0)
Alkaline Phosphatase: 100 U/L (ref 40–150)
BUN: 19.1 mg/dL (ref 7.0–26.0)
CO2: 23 mEq/L (ref 22–29)
Calcium: 9.9 mg/dL (ref 8.4–10.4)
Chloride: 96 mEq/L — ABNORMAL LOW (ref 98–107)
Creatinine: 0.9 mg/dL (ref 0.6–1.1)
Glucose: 201 mg/dl — ABNORMAL HIGH (ref 70–99)
Potassium: 3.9 mEq/L (ref 3.5–5.1)
Sodium: 131 mEq/L — ABNORMAL LOW (ref 136–145)
Total Bilirubin: 0.41 mg/dL (ref 0.20–1.20)
Total Protein: 6.7 g/dL (ref 6.4–8.3)

## 2012-08-28 NOTE — Patient Instructions (Signed)
Dr Nelly Rout in gyn oncology will see you on March 27 arrive 9:15 for 9:45  Dr Lorin Picket MacDiarmid at [facility] will see you on March 17. Arrive 1:15 for 1:30 appointment.  That office will mail directions to you.  Office phone is [phone]  Dr Darrold Span will see you at any time if needed, but your main follow up now for the cancer will be with Drs Roselind Messier and Nelly Rout

## 2012-08-28 NOTE — Progress Notes (Signed)
OFFICE PROGRESS NOTE   08/28/2012   Physicians:J.Soper/ W.Brewster, J.Kinard, M.Mahoney (PCP, [name]), Phillip Heal (cardiology, [name]), O'Neal (GI, [name]), O'Toole (pain clinic Jonita Albee), S.MacDiarmid (new patient appointment 09-23-12)   INTERVAL HISTORY:   Patient is seen, alone for visit, in follow up of IIIC uterine carcinosarcoma, now on observation following surgery, chemotherapy and radiation. She saw Dr Roselind Messier in Dec and will see Dr Nelly Rout in March, alternating every 3 months with those MDs.  History is of vaginal spotting beginning Dec 2012, with gyn evaluation delayed because of orthopedic problems then. She saw gynecologist in Feb 2013, with endometrial biopsy revealing endometrial cancer, tho specimen was scant. She was seen in consultation by Dr.Paola Duard Brady 09-27-2011 and had surgery by Dr.Soper at [facility] 10-06-2011, which was robotic hysterectomy/BSO/ bilateral pelvic lymphadenectomy (periaortic nodes not visualized). [facility] [phone]) from 10-06-11 showed carcinosarcoma, heterologous type with rhabdomyosarcomatous differentiation, FIGO grade 3, near transmural invasion in anterior and posterior lower uterine segment (less than 1 mm from serosal surface). 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 With high risk for recurrence in upper abdomen and for distant recurrence, Dr Kyla Balzarine recommended taxane + carboplatin given in sandwich fashion with RT; taxotere was chosen in preference to taxol due to preexisting peripheral neuropathy. Cycle 1 Ledell Noss /taxotere was given on 11-22-11 and cycle 2 with same drugs on 12-13-11. She was hospitalized in [place] after cycle 1 chemotherapy with symptomatic Afib with RVR. Attempts at cardioversion were not successful; she is being followed by cardiology in [place].  With the Afib, she was initially on xarelto, changed to ASA particularly due to thrombocytopenia with chemotherapy. Performance status and peripheral neuropathy by cycle 3 did not allow taxotere, with carboplatin only used for remainder of chemotherapy. She was treated with the chemotherapy in sandwich fashion with RT, completing 4500 cGY external beam RT in [place] then HDR x3 in [place] thru 03-26-12. She had 3 additional cycles of carboplatin thru 04-30-12, even the single agent carboplatin difficult for her to tolerate. She had restaging CT AP in [facility] system 06-19-12, with no findings of concern for active or progressive malignancy, and saw Dr Roselind Messier 06-20-12; she is to see Dr Roselind Messier next 12-19-12. She saw Dr Nelly Rout last for gyn oncology in July 2013 and will see her again 10-03-12 per my discussion with that office now.  Oncologic history also includes left mastectomy without axillary node evaluation in 2002  for early stage breast cancer, no adjuvant therapy.  Patient has been feeling much better overall in past couple of months. Her appetite is now very good and she has gained weight. She is wearing orthotic shoes for the foot problems, followed regularly at wound center near [place]. The ophthalmologic problems are stable. She had "injections", apparently steroids, for chronic neck pain by Dr Laurian Brim yesterday, which already seem helpful. She has had no symptoms of infection. She denies abdominal or pelvic discomfort. Bowels are generally moving daily and the hemorrhoids are better, still occasional bleeding from these. She still is up to void ~ hourly every night, as she has done x years.  Remainder of 10 point Review of Systems negative.   We have discussed again today and she has finally agreed to urology evaluation, as very frequent voiding really impacts quality of life. [facility] has kindly coordinated new patient appointment with Dr Sherron Monday in early March  when she has to come to  [place].  She celebrated her birthday [place] 68 family members [place] beach [place] Jan.  Objective:  Vital signs in last 24 hours:  BP 156/76  Pulse 112  Temp(Src) 96.9 F (36.1 C) (Oral)  Resp 20  Ht 5\' 3"  (1.6 m)  Wt 151 lb 1.6 oz (68.539 kg)  BMI 26.77 kg/m2 Weight is up 5 lbs. Looks the best that I have seen her. Ambulatory without assistance in the orthotic shoes.    HEENT:PERRLA, sclera clear, anicteric and oropharynx clear, no lesions Limited ROM neck as baseline. Multiple injection sites along cervical and upper T spine. LymphaticsCervical, supraclavicular, and axillary nodes normal. Resp: clear to auscultation bilaterally and normal percussion bilaterally Cardio: irregularly irregular rhythm GI: soft, non-tender; bowel sounds normal; no masses,  no organomegaly Extremities: no edema, cords, tenderness Neuro: no focal changes Patient did not want to undress for breast exam. Portacath/PICC-without erythema  Lab Results:  Results for orders placed in visit on 06/20/12  CBC WITH DIFFERENTIAL      Result Value Range   WBC 6.6  3.9 - 10.3 10e3/uL   NEUT# 5.1  1.5 - 6.5 10e3/uL   HGB 10.5 (*) 11.6 - 15.9 g/dL   HCT 16.1 (*) 09.6 - 04.5 %   Platelets 277  145 - 400 10e3/uL   MCV 86.9  79.5 - 101.0 fL   MCH 29.1  25.1 - 34.0 pg   MCHC 33.5  31.5 - 36.0 g/dL   RBC 4.09 (*) 8.11 - 9.14 10e6/uL   RDW 19.7 (*) 11.2 - 14.5 %   lymph# 0.6 (*) 0.9 - 3.3 10e3/uL   MONO# 0.9  0.1 - 0.9 10e3/uL   Eosinophils Absolute 0.0  0.0 - 0.5 10e3/uL   Basophils Absolute 0.0  0.0 - 0.1 10e3/uL   NEUT% 77.1 (*) 38.4 - 76.8 %   LYMPH% 9.1 (*) 14.0 - 49.7 %   MONO% 13.4  0.0 - 14.0 %   EOS% 0.3  0.0 - 7.0 %   BASO% 0.1  0.0 - 2.0 %  COMPREHENSIVE METABOLIC PANEL (CC13)      Result Value Range   Sodium 134 (*) 136 - 145 mEq/L   Potassium 3.5  3.5 - 5.1 mEq/L   Chloride 99  98 - 107 mEq/L   CO2 25  22 - 29 mEq/L   Glucose 110 (*) 70 - 99 mg/dl   BUN 78.2  7.0 - 95.6 mg/dL   Creatinine 0.9   0.6 - 1.1 mg/dL   Total Bilirubin 2.13  0.20 - 1.20 mg/dL   Alkaline Phosphatase 86  40 - 150 U/L   AST 21  5 - 34 U/L   ALT 21  0 - 55 U/L   Total Protein 6.3 (*) 6.4 - 8.3 g/dL   Albumin 3.6  3.5 - 5.0 g/dL   Calcium 9.4  8.4 - 08.6 mg/dL     Studies/Results: CT ABDOMEN AND PELVIS WITH CONTRAST 06-19-12 Technique: Multidetector CT imaging of the abdomen and pelvis was  performed following the standard protocol during bolus  administration of intravenous contrast.  Contrast: OMNIPAQUE IOHEXOL 300 MG/ML SOLN the patient  received 13 our prior prep contrast allergy. No adverse event.  Comparison: CT 01/25/2012  Findings: Lung bases are clear. No pericardial fluid. No focal  hepatic lesion. No nodularity of the liver. No peritoneal  disease.  Multiple gallstones in the gallbladder. Pancreas, spleen, adrenal  glands, and kidneys are normal.  There  is some stasis of the enteric contents of the distal small  bowel. No evidence of obstruction. The distal colon is collapsed.  Abdominal aorta normal caliber. No retroperitoneal periportal  lymphadenopathy.  No free fluid the pelvis. Post hysterectomy anatomy. There is no  pelvic lymphadenopathy. Bladder is normal.  Review of the bone windows demonstrates no aggressive osseous  lesions.  IMPRESSION:  1. No evidence of uterine carcinoma recurrence in the abdomen or  pelvis.  2. Stasis of enteric contents of the distal small bowel.   Medications: I have reviewed the patient's current medications.  Assessment/Plan:  1.Uterine carcinosarcoma: history as above, clinically doing well. She will follow up with Drs Nelly Rout and Roselind Messier every 3 months as scheduled. I will see her back at any time if she or other physicians feel that I can be of help, however I have not given her a scheduled appointment back at this office. 2.Atrial fibrillation with RVR, controlled now with medication  3. History of degenerative back and neck problems:  under care of  pain clinic in [address]  4.HTN, elevated lipids  5.vision problems OS around time of onset gyn symptoms  6. left mastectomy WITHOUT axillary node removal for early stage breast cancer 2002  7. Urinary frequency especially at night x years, with sleep difficulty related. SHe has finally agreed to urology referral, to be seen early March. 8.improvement in foot ulcer, following with local wound center. 9. Stable mild anemia. WBC elevated today apparently from steroid injections yesterday.   Patient agrees with plan and is very appreciative of care.      Schyler Butikofer P, MD   08/28/2012, 10:31 AM

## 2012-08-30 ENCOUNTER — Telehealth: Payer: Self-pay | Admitting: *Deleted

## 2012-08-30 NOTE — Telephone Encounter (Signed)
Dr Darrold Span office notes from 08/28/12 and most recent labs faxed to Dr Alfredo Martinez.

## 2012-09-03 ENCOUNTER — Telehealth: Payer: Self-pay

## 2012-09-03 NOTE — Telephone Encounter (Signed)
Message copied by Lorine Bears on Tue Sep 03, 2012  5:12 PM ------      Message from: Jama Flavors P      Created: Wed Aug 28, 2012  9:15 PM       Labs seen and need follow up: please have HIM send CBC and CMET to her PCP - may need to confirm PCP with patient  thanks ------

## 2012-09-03 NOTE — Telephone Encounter (Signed)
Spoke with Ms. Zea and she said that her PCP is Dr. Loraine Leriche t. Mahoney in [place]. Electronically  faxed lab work to PCP as requested by Dr. Darrold Span.

## 2012-09-12 ENCOUNTER — Ambulatory Visit: Payer: Medicare Other | Admitting: Gynecologic Oncology

## 2012-10-03 ENCOUNTER — Ambulatory Visit: Payer: Medicare Other | Attending: Gynecologic Oncology | Admitting: Gynecologic Oncology

## 2012-10-03 ENCOUNTER — Other Ambulatory Visit (HOSPITAL_COMMUNITY): Payer: Self-pay | Admitting: Gynecologic Oncology

## 2012-10-03 ENCOUNTER — Encounter: Payer: Self-pay | Admitting: Gynecologic Oncology

## 2012-10-03 ENCOUNTER — Ambulatory Visit (HOSPITAL_COMMUNITY)
Admission: RE | Admit: 2012-10-03 | Discharge: 2012-10-03 | Disposition: A | Discharge status: Home / Self Care | Payer: Medicare Other | Source: Ambulatory Visit | Attending: Gynecologic Oncology | Admitting: Gynecologic Oncology

## 2012-10-03 ENCOUNTER — Telehealth: Payer: Self-pay | Admitting: Gynecologic Oncology

## 2012-10-03 VITALS — BP 126/80 | HR 66 | Temp 97.6°F | Resp 18 | Ht 63.0 in | Wt 148.4 lb

## 2012-10-03 DIAGNOSIS — R609 Edema, unspecified: Secondary | ICD-10-CM | POA: Insufficient documentation

## 2012-10-03 DIAGNOSIS — R600 Localized edema: Secondary | ICD-10-CM

## 2012-10-03 DIAGNOSIS — K469 Unspecified abdominal hernia without obstruction or gangrene: Secondary | ICD-10-CM

## 2012-10-03 DIAGNOSIS — R209 Unspecified disturbances of skin sensation: Secondary | ICD-10-CM | POA: Insufficient documentation

## 2012-10-03 DIAGNOSIS — K429 Umbilical hernia without obstruction or gangrene: Secondary | ICD-10-CM | POA: Insufficient documentation

## 2012-10-03 DIAGNOSIS — C541 Malignant neoplasm of endometrium: Secondary | ICD-10-CM

## 2012-10-03 DIAGNOSIS — M7120 Synovial cyst of popliteal space [Baker], unspecified knee: Secondary | ICD-10-CM | POA: Insufficient documentation

## 2012-10-03 DIAGNOSIS — C55 Malignant neoplasm of uterus, part unspecified: Secondary | ICD-10-CM | POA: Insufficient documentation

## 2012-10-03 NOTE — Telephone Encounter (Signed)
Patient's son informed of doppler results and Dr. Forrestine Him recommendation for Outpatient Rehab services for lymphedema treatment.  Referral form sent to the [facility] on [address]  No concerns voiced.  Instructed to call for any needs.

## 2012-10-03 NOTE — Progress Notes (Signed)
*  PRELIMINARY RESULTS* Vascular Ultrasound Lower extremity venous duplex has been completed.  Preliminary findings: Bilaterally no evidence of DVT.  Baker's cyst on the right, unchanged from 01/15/12.  Farrel Demark, RDMS, RVT  10/03/2012, 11:01 AM

## 2012-10-03 NOTE — Progress Notes (Signed)
Office Visit  Note: Gyn-Onc  Kaylee Pugh 79 y.o. female  CC:  Surveillance uterine carcinosarcoma  HPI: Patient is an 79 year old gravida 4 para 4 who began having some bleeding December 23 of the year 2012.  An endometrial biopsy 08/2011 office revealed endometrial cancer.The specimen was scant and fragmented and they were not able to give her final classification or grating.  Interval History:  10/06/2011 underwent RTLH BSO BPLND at [facility] by Dr. Kyla Pugh at [facility].  Path Diagnosis:  A:Histologic type: Carcinosarcoma (malignant mixed M llerian tumor), heterologous type with rhabdomyosarcomatous differentiation (see comment) Histologic grade: FIGO grade 3 Tumor site: Primary endometrial cancer Myometrial invasion: Near transmural invasion in the anterior and posterior lower uterine segment, 1.5 cm in depth, less than 1 mm from the serosa Serosal involvement: Extremely close, less than 1 mm to serosal surface Cervical involvement: - Not involved by the endometrial carcinosarcoma. - High grade squamous intraepithelial lesion, cervical intraepithelial neoplasia grade 3 (CIN 3) is present in multiple sections from the cervix with extension into superficial endocervical glands, margins are uninvolved Other involved sites: left pelvic lymph nodes Lymphovascular space invasion: Present Regional lymph nodes (see other specimens B and C): Total number involved: 2  Total number examined: 13 FIGO (2009 classification) Stage Grouping: IIIC1  The patient has received three cycles of Taxoterel/Carboplatin.  CT scan Abd/Pelvis 12/2011 without evidence of residual disease. She subsequently received external beam pelvic radiotherapy and vaginal brachytherapy that was completed in September of 2013. She then received 3 additional cycles of carboplatin.  . Of note carboplatin is poorly tolerated. A CT of the abdomen and pelvis performed in December of 2030 without any evidence of progressive disease.  Kaylee Pugh  reports umbilical pain that is sometimes tender. She states that there is a hernia and that it is reducible she denies any association with nausea vomiting or erythema. She also reports lower extremity edema, right greater than left, she states her cardiologist states that this is not associated with her heart function. The patient states that there was a left lower extremity Doppler completed last year that was negative she states that the edema has worsened over the last few weeks.    Social Hx:    Celebrated her birthday in January that was attended by over 40 people. She states that this was "the best birthday ever".  Past Surgical Hx:  Consistent of a bladder tacking for urinary incontinence. She is a gravida 4 para 4. At mastectomy for an early stage breast cancer in 2002 and did not require any additional chemotherapy radiation or adjuvant hormonal management.  Past Surgical History  Procedure Laterality Date  . Cataract extraction  1987    Left eye  . Cataract extraction  2002    Right eye  . Retinal detachment surgery  2000  . Bladder repair  2001  . Tubal ligation  1966  . Appendectomy  1966  . Mastectomy  2002    left  . Abdominal hysterectomy    . Tee without cardioversion  12/01/2011    Procedure: TRANSESOPHAGEAL ECHOCARDIOGRAM (TEE);  Surgeon: Ricki Rodriguez, MD;  Location: [facility] ENDOSCOPY;  Service: Cardiovascular;  Laterality: N/A;  . Cardioversion  12/01/2011    Procedure: CARDIOVERSION;  Surgeon: Ricki Rodriguez, MD;  Location: [facility] ENDOSCOPY;  Service: Cardiovascular;  Laterality: N/A;    Past Medical Hx:  Past Medical History  Diagnosis Date  . Breast cancer   . Hypertension   . Hyperlipidemia   . Thyroid disease   .  Osteoporosis   . Post-menopausal bleeding   . 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 Low sodium     hx of x 2 per pt and son  . Radiation 01/30/12-03/05/12    6300 cGy external beam radiation  . Radiation      Brachytherapy 1800 cGy intracavitary  . History of chemotherapy     Taxotere/carboplatin   Pap test December 2013 within normal limits  REVIEW OF SYSTEMS Constitutional  Feels well, but experiences significant fatigue, has an excellent appetite.  Cardiovascular  No chest pain, bilateral lower extremity edema over the past few weeks worsening. The edema is not improved with elevation of the legs or with use of her compression knee-high  Pulmonary  No cough or wheeze no hemoptysis Gastro Intestinal  No nausea, vomitting, or diarrhoea. Denies rectal bleeding reports intermittent abdominal pain associated with heavy left  Genito Urinary  No frequency, urgency, dysuria.  Reports nocturia longstanding. Musculo Skeletal  Occasional  Myalgia, Neurologic  No weakness, reports numbness of the feet and hands lonstanding, no change in gait, reports hearing loss Psychology  No depression, anxiety, insomnia.    Vitals:  Blood pressure 126/80, pulse 66, temperature 97.6 F (36.4 C), temperature source Oral, resp. rate 18, height 5\' 3"  (1.6 m), weight 148 lb 6.4 oz (67.314 kg).  Physical Exam: Nourished well developed elderly female in no acute distress. Patient appears younger than stated age.  Neck: No lymphadenopathy no thyromegaly.  Lungs: Clear to auscultation bilaterally.  Cardiovascular: Regular rate and rhythm.  Abdomen: Well-healed infraumbilical vertical incision. There is no evidence of an incisional hernias at the trocar sites. She has palpable 2 cm umbilical hernia that is not tender and reducible. There are no masses or nodularity.  Lymph Nodes No cervical supraclavicular or inguinal lymphadenopathy.  Back:  No CVAT  Pelvic: External genitalia within normal limits,  The vagina is markedly atrophic. Left paravaginal sidewall prolapse. The cuff is intact, no nodularity in the cul de sac  There is no nodularity. There are cul de sac masses.   Rectal: Good tone no  masses  Extremities: 3+ left lower extremity edema 2+ right lower extremity edema.   Assessment/Plan: 79 year old with stage IIIC1 uterine carcinosarcoma. Minimally invasive endometrial cancer staging was performed by Dr. Ronita Hipps in March of 2013. She since receive 3 cycles of Taxotere and carboplatin therapy external beam radiation therapy and brachytherapy completed March 26, 2012.  She then received 3 cycles of single agent carboplatin.   Followup with Dr. Roselind Messier in 3 months Followup with GYN oncology in 6 months  The patient is without any evidence of disease. She reports that the periumbilical hernia is intermittently tender. She was counseled to avoid heavy lifting and the signs and symptoms of obstruction. A referral for repair was suggested but the patient declines at this time.  There is notable lower extremity edema and the patient states that her cardiologist is adamant that this is not related to her cardiac function. Bilateral lower extremity Dopplers have been ordered to evaluate for the presence of a DVT. If this study is negative I will refer the patient to lymphedema clinic.       Laurette Schimke, MD PhD 10/03/2012, 10:06 AM

## 2012-10-03 NOTE — Patient Instructions (Addendum)
  Followup with Dr. Roselind Messier in 3 months Followup with GYN oncology in 6 months  Followup for lower extremity Doppler study this afternoon  Hernia A hernia occurs when an internal organ pushes out through a weak spot in the abdominal wall. Hernias most commonly occur in the groin and around the navel. Hernias often can be pushed back into place (reduced). Most hernias tend to get worse over time. Some abdominal hernias can get stuck in the opening (irreducible or incarcerated hernia) and cannot be reduced. An irreducible abdominal hernia which is tightly squeezed into the opening is at risk for impaired blood supply (strangulated hernia). A strangulated hernia is a medical emergency. Because of the risk for an irreducible or strangulated hernia, surgery may be recommended to repair a hernia. CAUSES   Heavy lifting.  Prolonged coughing.  Straining to have a bowel movement.  A cut (incision) made during an abdominal surgery. HOME CARE INSTRUCTIONS   Bed rest is not required. You may continue your normal activities.  Avoid lifting more than 10 pounds (4.5 kg) or straining.  Cough gently. If you are a smoker it is best to stop. Even the best hernia repair can break down with the continual strain of coughing. Even if you do not have your hernia repaired, a cough will continue to aggravate the problem.  Do not wear anything tight over your hernia. Do not try to keep it in with an outside bandage or truss. These can damage abdominal contents if they are trapped within the hernia sac.  Eat a normal diet.  Avoid constipation. Straining over long periods of time will increase hernia size and encourage breakdown of repairs. If you cannot do this with diet alone, stool softeners may be used. SEEK IMMEDIATE MEDICAL CARE IF:   You have a fever.  You develop increasing abdominal pain.  You feel nauseous or vomit.  Your hernia is stuck outside the abdomen, looks discolored, feels hard, or is  tender.  You have any changes in your bowel habits or in the hernia that are unusual for you.  You have increased pain or swelling around the hernia.  You cannot push the hernia back in place by applying gentle pressure while lying down. MAKE SURE YOU:   Understand these instructions.  Will watch your condition.  Will get help right away if you are not doing well or get worse. Document Released: 06/26/2005 Document Revised: 09/18/2011 Document Reviewed: 02/13/2008 [facility] Patient Information 2013 [place], [place].

## 2012-10-10 ENCOUNTER — Telehealth: Payer: Self-pay | Admitting: *Deleted

## 2012-10-10 NOTE — Telephone Encounter (Signed)
Tc call from son with request for referral to [facility] lympnedema clinic as she cannot come to [facility] for treatment.  Referral placed with Katrina @ [facility] ph [phone] who will contact son and arrange appointment.  Order faxed to [phone].

## 2012-12-18 ENCOUNTER — Encounter: Payer: Self-pay | Admitting: Radiation Oncology

## 2012-12-18 DIAGNOSIS — C55 Malignant neoplasm of uterus, part unspecified: Secondary | ICD-10-CM | POA: Insufficient documentation

## 2012-12-19 ENCOUNTER — Other Ambulatory Visit (HOSPITAL_COMMUNITY)
Admission: RE | Admit: 2012-12-19 | Discharge: 2012-12-19 | Disposition: A | Discharge status: Home / Self Care | Payer: Medicare Other | Source: Ambulatory Visit | Attending: Radiation Oncology | Admitting: Radiation Oncology

## 2012-12-19 ENCOUNTER — Ambulatory Visit
Admission: RE | Admit: 2012-12-19 | Discharge: 2012-12-19 | Disposition: A | Discharge status: Home / Self Care | Payer: Medicare Other | Source: Ambulatory Visit | Attending: Radiation Oncology | Admitting: Radiation Oncology

## 2012-12-19 ENCOUNTER — Encounter: Payer: Self-pay | Admitting: Radiation Oncology

## 2012-12-19 VITALS — BP 137/80 | HR 84 | Temp 97.5°F | Resp 20 | Wt 144.6 lb

## 2012-12-19 DIAGNOSIS — C55 Malignant neoplasm of uterus, part unspecified: Secondary | ICD-10-CM

## 2012-12-19 DIAGNOSIS — Z124 Encounter for screening for malignant neoplasm of cervix: Secondary | ICD-10-CM | POA: Insufficient documentation

## 2012-12-19 DIAGNOSIS — I4891 Unspecified atrial fibrillation: Secondary | ICD-10-CM | POA: Insufficient documentation

## 2012-12-19 HISTORY — DX: Unspecified atrial fibrillation: I48.91

## 2012-12-19 NOTE — Progress Notes (Addendum)
Pt c/o generalized pain associated w/arthritis. She denies other pain, bowel issues, vaginal discharge, loss of appetite. She also c/o fatigue, nocturia x 2-3 which she states she has had for many years. Pt states Dr Nelly Rout did pap smear in March 2014. No LE lymphedema today; pt states she never went to lymphedema clinic because swelling resolved after pt was put on Lasix 20 mg bid.

## 2012-12-19 NOTE — Progress Notes (Signed)
Radiation Oncology         [phone] ________________________________  Name: Kaylee Pugh MRN: [id number]  Date: 12/19/2012  DOB: Jun 22, 1925  Follow-Up Visit Note  CC: Alvie Heidelberg, PA-C  Diagnosis:   Stage III-C uterine carcinosarcoma  Interval Since Last Radiation:  9  months  Narrative:  The patient returns today for routine follow-up.  She is doing recently well. She does occasionally complain of some mild shortness of breath. I recommend she discuss this issue with her primary care physician. She denies any pain in the chest area. She denies any vaginal bleeding hematuria or rectal bleeding. She denies a problems with constipation or diarrhea. The patient has not been using her vaginal dilator and I stressed the importance of this procedure. She was given an additional dilator today.                              ALLERGIES:  is allergic to iodine.  Meds: Current Outpatient Prescriptions  Medication Sig Dispense Refill  . acetaminophen (TYLENOL) 500 MG tablet Take 500 mg by mouth every 6 (six) hours as needed. Pain      . ALPRAZolam (XANAX) 0.25 MG tablet Take 0.25 mg by mouth at bedtime as needed.       Marland Kitchen aspirin 81 MG tablet Take 81 mg by mouth daily.      . B Complex-Folic Acid (B COMPLEX-VITAMIN B12) TABS Take 1 tablet by mouth daily.  100 tablet  prn  . Calcium Carbonate-Vitamin D (CALCIUM + D) 600-200 MG-UNIT TABS Take 2 tablets by mouth at bedtime.       Marland Kitchen diltiazem (CARDIZEM CD) 180 MG 24 hr capsule Take 180 mg by mouth daily.      . furosemide (LASIX) 20 MG tablet       . HYDROcodone-acetaminophen (VICODIN) 5-500 MG per tablet Take 1 tablet by mouth every 6 (six) hours as needed.      Marland Kitchen levothyroxine (SYNTHROID, LEVOTHROID) 50 MCG tablet Take 50 mcg by mouth daily.      Marland Kitchen omeprazole (PRILOSEC) 20 MG capsule Take 20 mg by mouth Daily.      . Probiotic Product (PROBIOTIC DAILY PO) Take 1 capsule by mouth. OTC      . rosuvastatin (CRESTOR) 5 MG  tablet Take 5 mg by mouth daily.       No current facility-administered medications for this encounter.    Physical Findings: The patient is in no acute distress. Patient is alert and oriented.  weight is 144 lb 9.6 oz (65.59 kg). Her oral temperature is 97.5 F (36.4 C). Her blood pressure is 137/80 and her pulse is 84. Her respiration is 20. Marland Kitchen  No palpable supraclavicular or axillary adenopathy. The lungs are clear to auscultation. The heart has a regular rhythm and rate. The abdomen is soft and nontender with normal bowel sounds. There is no inguinal adenopathy. On pelvic examination the external genitalia are unremarkable. A speculum exam is performed. There is a erythematous lesion along the anterior mid vaginal area. A Pap smear was obtained of this area appear. She did have some mild adhesions which are broken during the examination. This caused some very mild bleeding. There are no obvious mucosal lesions noted on speculum exam. On bimanual and rectovaginal examination there no pelvic masses appreciated.  Lab Findings: Lab Results  Component Value Date   WBC 10.6* 08/28/2012   HGB 10.6* 08/28/2012   HCT  32.7* 08/28/2012   MCV 80.6 08/28/2012   PLT 201 08/28/2012     Radiographic Findings: No results found.  Impression:  No evidence of recurrence on clinical exam today, Pap smear pending  Plan:  The patient will return in 6 months for followup. In the interim the patient will be seen by gynecologic oncology.  _____________________________________  -----------------------------------  Billie Lade, PhD, MD

## 2012-12-27 ENCOUNTER — Telehealth: Payer: Self-pay | Admitting: *Deleted

## 2012-12-27 NOTE — Telephone Encounter (Addendum)
Called and spoke to Mr. Kaylee Pugh, son of Ms. Kaylee Pugh, to relay information that [name]'s PAP smear from 12/19/16 is normal.  Her son expressed thanks

## 2013-03-27 ENCOUNTER — Encounter: Payer: Self-pay | Admitting: Gynecologic Oncology

## 2013-03-27 ENCOUNTER — Ambulatory Visit: Payer: Medicare Other | Attending: Gynecologic Oncology | Admitting: Gynecologic Oncology

## 2013-03-27 VITALS — BP 110/74 | HR 74 | Temp 98.8°F | Resp 16 | Ht 63.0 in | Wt 145.5 lb

## 2013-03-27 DIAGNOSIS — Z853 Personal history of malignant neoplasm of breast: Secondary | ICD-10-CM | POA: Insufficient documentation

## 2013-03-27 DIAGNOSIS — Z9221 Personal history of antineoplastic chemotherapy: Secondary | ICD-10-CM | POA: Insufficient documentation

## 2013-03-27 DIAGNOSIS — Z923 Personal history of irradiation: Secondary | ICD-10-CM | POA: Insufficient documentation

## 2013-03-27 DIAGNOSIS — I10 Essential (primary) hypertension: Secondary | ICD-10-CM | POA: Insufficient documentation

## 2013-03-27 DIAGNOSIS — Z9010 Acquired absence of unspecified breast and nipple: Secondary | ICD-10-CM | POA: Insufficient documentation

## 2013-03-27 DIAGNOSIS — C541 Malignant neoplasm of endometrium: Secondary | ICD-10-CM

## 2013-03-27 DIAGNOSIS — E785 Hyperlipidemia, unspecified: Secondary | ICD-10-CM | POA: Insufficient documentation

## 2013-03-27 DIAGNOSIS — N8112 Cystocele, lateral: Secondary | ICD-10-CM | POA: Insufficient documentation

## 2013-03-27 DIAGNOSIS — K429 Umbilical hernia without obstruction or gangrene: Secondary | ICD-10-CM | POA: Insufficient documentation

## 2013-03-27 DIAGNOSIS — Z9079 Acquired absence of other genital organ(s): Secondary | ICD-10-CM | POA: Insufficient documentation

## 2013-03-27 DIAGNOSIS — M810 Age-related osteoporosis without current pathological fracture: Secondary | ICD-10-CM | POA: Insufficient documentation

## 2013-03-27 DIAGNOSIS — C549 Malignant neoplasm of corpus uteri, unspecified: Secondary | ICD-10-CM | POA: Insufficient documentation

## 2013-03-27 DIAGNOSIS — N952 Postmenopausal atrophic vaginitis: Secondary | ICD-10-CM | POA: Insufficient documentation

## 2013-03-27 DIAGNOSIS — Z90710 Acquired absence of both cervix and uterus: Secondary | ICD-10-CM | POA: Insufficient documentation

## 2013-03-27 NOTE — Patient Instructions (Signed)
Plan to see Dr. Roselind Messier in three months and Dr. Nelly Rout with GYN Oncology in six months.  Please call in Dec. 2014 or Jan 2015 to schedule an appointment in March 2015 with Dr. Nelly Rout.

## 2013-03-27 NOTE — Progress Notes (Signed)
Office Visit  Note: Gyn-Onc  Kaylee Pugh 79 y.o. female  CC:  Surveillance uterine carcinosarcoma  Assessment/Plan: 79 y.o. with stage IIIC1 uterine carcinosarcoma. Minimally invasive endometrial cancer staging was performed by Dr. Ronita Hipps in March of 2013. She since receive 3 cycles of Taxotere and carboplatin therapy external beam radiation therapy and brachytherapy completed March 26, 2012.  She then received 3 cycles of single agent carboplatin.   Followup with Dr. Roselind Messier in 3 months Followup with GYN oncology in 6 months  The patient is without any evidence of disease.   She reports intermittent significant bilateral lower extremity edeam.  The swollen There is notable lower extremity edema of her ankles and feet.  She states that  She is on lasix 40mg  daily without any diuresis.  [place] contacted Marrian Salvage  NP and she will see the patient as assess for cardiac failure.  HPI: Patient is an 79 year old gravida 4 para 4 who began having some bleeding December 23 of the year 2012.  An endometrial biopsy 08/2011 office revealed endometrial cancer.The specimen was scant and fragmented and they were not able to give her final classification or grading.   10/06/2011 underwent RTLH BSO BPLND at [facility] by Dr. Kyla Balzarine at [facility].  Path Diagnosis:  A:Histologic type: Carcinosarcoma (malignant mixed M llerian tumor), heterologous type with rhabdomyosarcomatous differentiation (see comment) Histologic grade: FIGO grade 3 Tumor site: Primary endometrial cancer Myometrial invasion: Near transmural invasion in the anterior and posterior lower uterine segment, 1.5 cm in depth, less than 1 mm from the serosa Serosal involvement: Extremely close, less than 1 mm to serosal surface Cervical involvement: - Not involved by the endometrial carcinosarcoma. 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  CT scan Abd/Pelvis 12/2011 without evidence of residual disease. She subsequently received external beam pelvic radiotherapy and vaginal brachytherapy that was completed in September of 2013. She then received 3 additional cycles of carboplatin.  . Of note carboplatin was poorly tolerated. A CT of the abdomen and pelvis performed in December of 2030 without any evidence of progressive disease. Interval History:  Kaylee Pugh intermittent BLE edema.  At the last visit it was significant ans dopler studies were ordered that were negative.  She was referred to lympedema clinic given the h/oPLND and XRT.  The patient did not f/u.  On examination today there is no significant edema.  The patient reports that lasix 40 daily intermittently causes diuresis.    Social Hx:    Son is engaged in her life.  Does her ADL  Past Surgical Hx:  Consistent of a bladder tacking for urinary incontinence. She is a gravida 4 para 4. At mastectomy for an early stage breast cancer in 2002 and did not require any additional chemotherapy radiation or adjuvant hormonal management.  Past Surgical History  Procedure Laterality Date  . Cataract extraction  1987    Left eye  . Cataract extraction  2002    Right eye  . Retinal detachment surgery  2000  . Bladder repair  2001  . Tubal ligation  1966  . Appendectomy  1966  .  Mastectomy  2002    left  . Abdominal hysterectomy    . Tee without cardioversion  12/01/2011    Procedure: TRANSESOPHAGEAL ECHOCARDIOGRAM (TEE);  Surgeon: Ricki Rodriguez, MD;  Location: [facility] ENDOSCOPY;  Service:  Cardiovascular;  Laterality: N/A;  . Cardioversion  12/01/2011    Procedure: CARDIOVERSION;  Surgeon: Ricki Rodriguez, MD;  Location: [facility] ENDOSCOPY;  Service: Cardiovascular;  Laterality: N/A;    Past Medical Hx:  Past Medical History  Diagnosis Date  . Breast cancer   . Hypertension   . Hyperlipidemia   . Thyroid disease   . Osteoporosis   . Post-menopausal bleeding   . 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 Low sodium     hx of x 2 per pt and son  . Radiation 01/30/12-03/05/12    6300 cGy external beam radiation, pelvis, vaginal cuff  . Radiation     Brachytherapy 1800 cGy intracavitary  . History of chemotherapy     Taxotere/carboplatin  . Uterine cancer   . Atrial fibrillation    Pap test June 2014 within normal limits  REVIEW OF SYSTEMS Constitutional  Feels well, but experiences significant fatigue, has an excellent appetite.  Cardiovascular  No chest pain,intermittent significant  bilateral lower extremity edema  Pulmonary  No cough or wheeze no hemoptysis Gastro Intestinal  No nausea, vomitting, or diarrhoea. Denies rectal bleeding  Genito Urinary  No frequency, urgency, dysuria.  Reports nocturia longstanding. States that diuretics have very little effect and often do not cause diuresis. Musculo Skeletal  Occasional  Myalgia, intermittent significant edema of the lower extremities. Neurologic  No weakness, reports numbness of the feet and hands lonstanding, no change in gait Psychology  No depression, anxiety, insomnia.    Vitals:  Blood pressure 110/74, pulse 74, temperature 98.8 F (37.1 C), resp. rate 16, height 5\' 3"  (1.6 m), weight 145 lb 8 oz (65.998 kg).  Physical Exam: Nourished well developed elderly female in no acute distress. Patient appears younger than stated age.  Neck: No lymphadenopathy no thyromegaly.  Lungs: Clear to auscultation bilaterally.  Cardiovascular: Irregular regular rate and  rhythm.  Abdomen: Well-healed infraumbilical vertical incision. There is no evidence of an incisional hernias at the trocar sites. She has palpable 2 cm umbilical hernia that is not tender and reducible. There are no masses or nodularity.  Lymph Nodes No cervical supraclavicular or inguinal lymphadenopathy.  Back:  No CVAT  Pelvic: External genitalia within normal limits,  The vagina is markedly atrophic. Left paravaginal sidewall prolapse.  Telangectasia at the vaginal cuff.  mThe cuff is intact, no nodularity in the cul de sac  There is no nodularity. There are cul de sac masses.   Rectal: Good tone no masses  Extremities: 1+ left lower extremity edema 1+ right lower extremity edema.      Laurette Schimke, MD PhD 03/27/2013, 8:56 AM

## 2013-03-31 ENCOUNTER — Telehealth: Payer: Self-pay

## 2013-03-31 NOTE — Telephone Encounter (Signed)
Mailed completed Cancer Registry form for [facility] and sent a copy to be scanned into the patient's EMR.

## 2013-05-21 IMAGING — CT CT ANGIO CHEST
1 of 2 series · 19 of 32 positions shown · IV contrast (APPLIED)
Comparison: Chest radiographs dated 11/29/2011

CLINICAL DATA: Atrial fibrillation, cancer, evaluate for PE

CT ANGIOGRAPHY CHEST
TECHNIQUE: Multidetector CT imaging of the chest using the
standard protocol during bolus administration of intravenous
contrast. Multiplanar reconstructed images including MIPs were
obtained and reviewed to evaluate the vascular anatomy.
Contrast: 80mL OMNIPAQUE IOHEXOL 300 MG/ML  SOLN

[Series 11: thins for pacs · axial · 0.74mm/px · z∈[+1430,+1623]mm · 19 of 215 slices shown]
[im 11/215  lung]
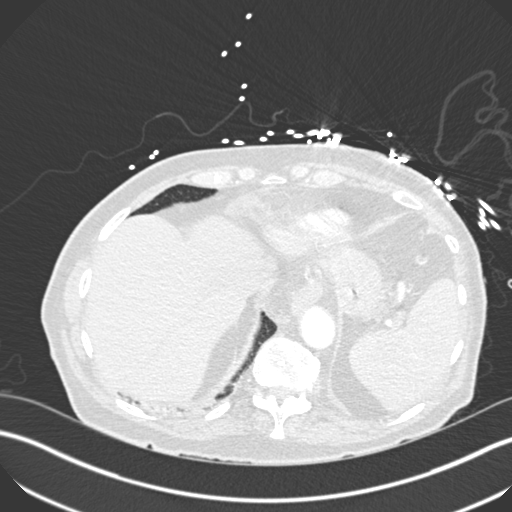
[im 22/215  mediastinal]
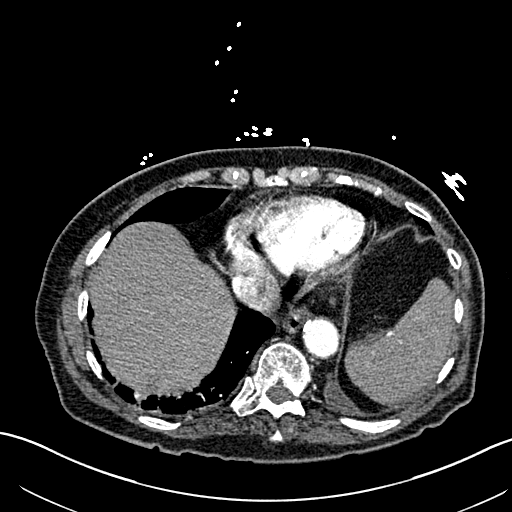
[im 33/215  lung]
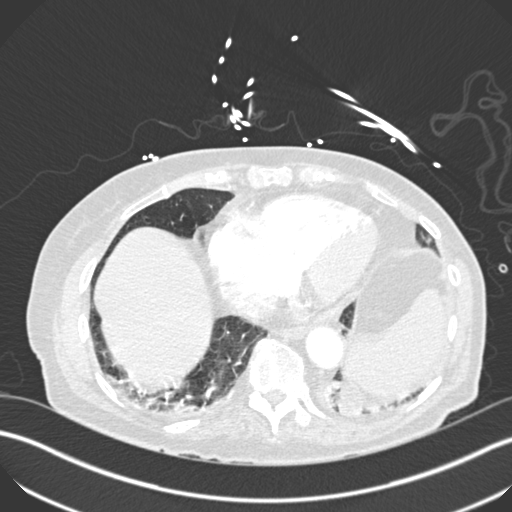
[im 54/215  mediastinal]
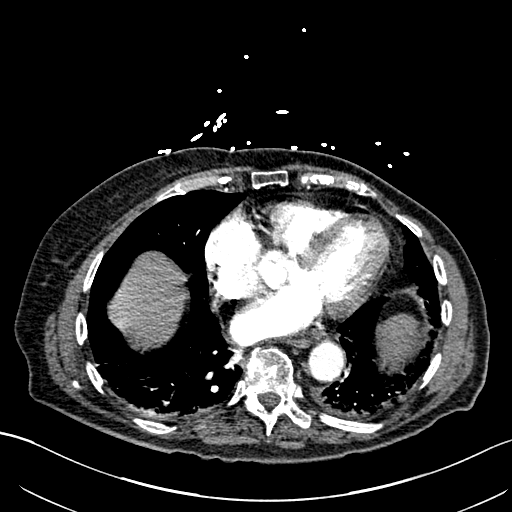
[im 65/215  lung]
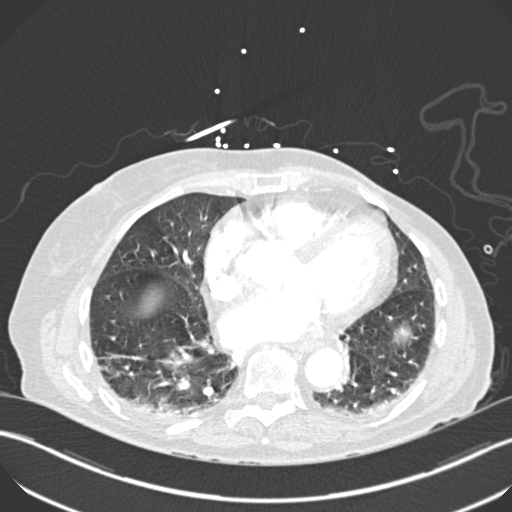
[im 72/215  mediastinal]
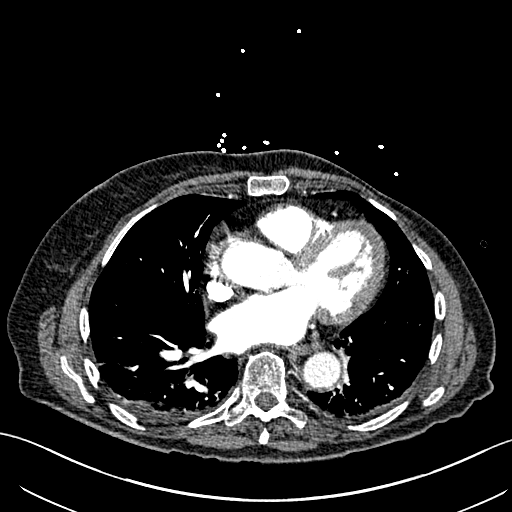
[im 75/215  lung]
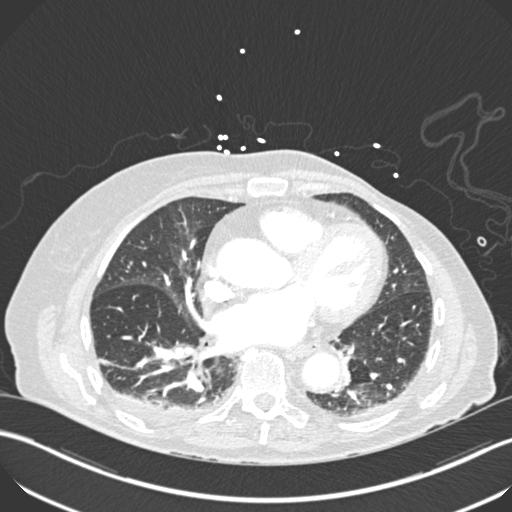
[im 86/215  mediastinal]
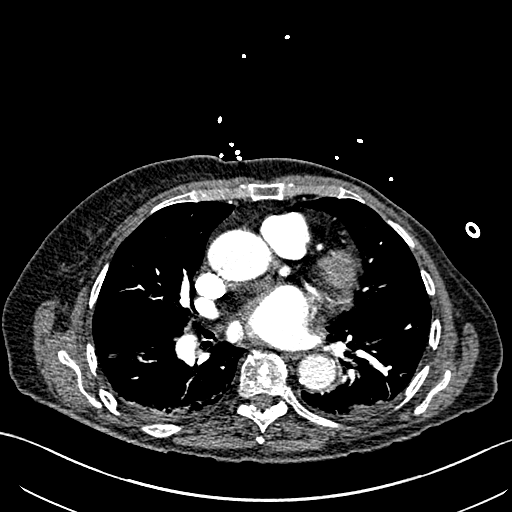
[im 97/215  lung]
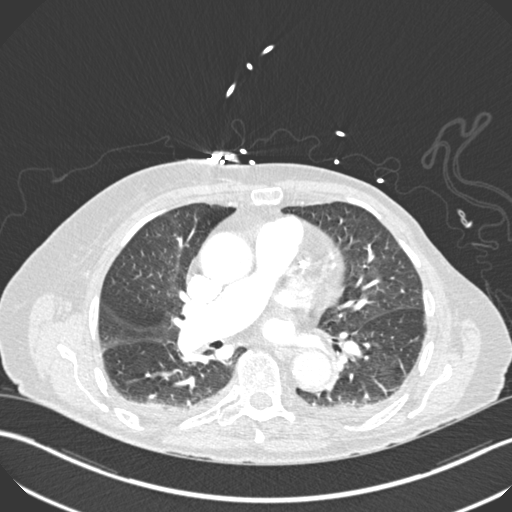
[im 108/215  mediastinal]
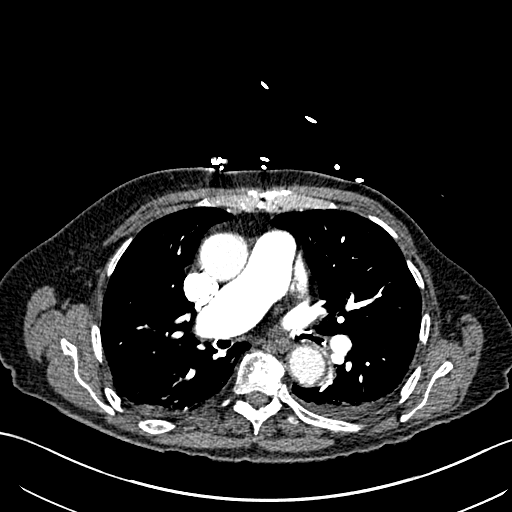
[im 118/215  lung]
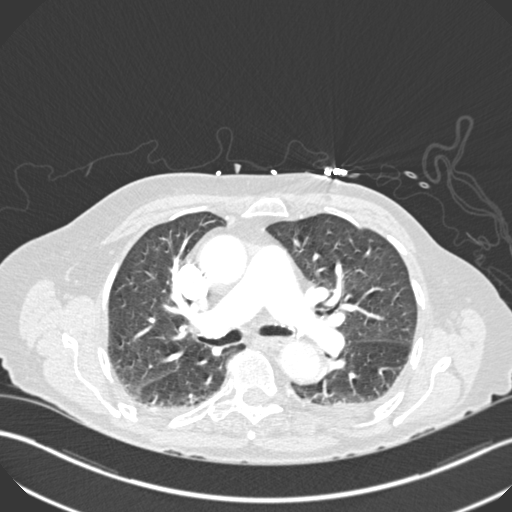
[im 129/215  mediastinal]
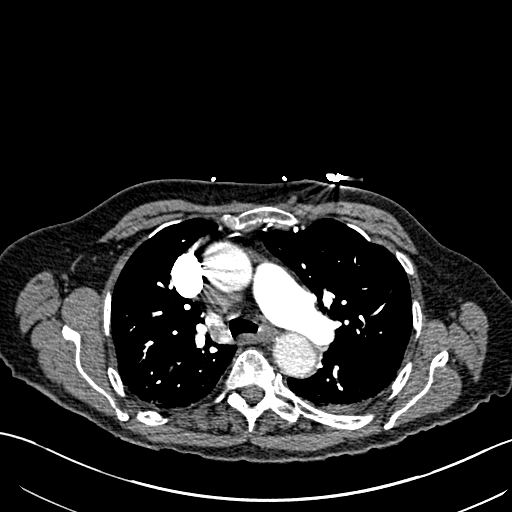
[im 140/215  lung]
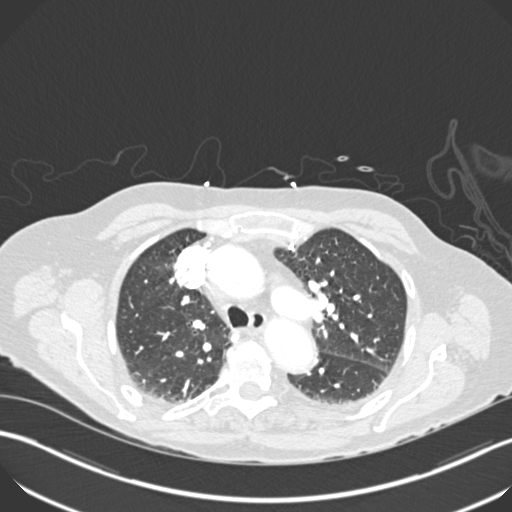
[im 143/215  mediastinal]
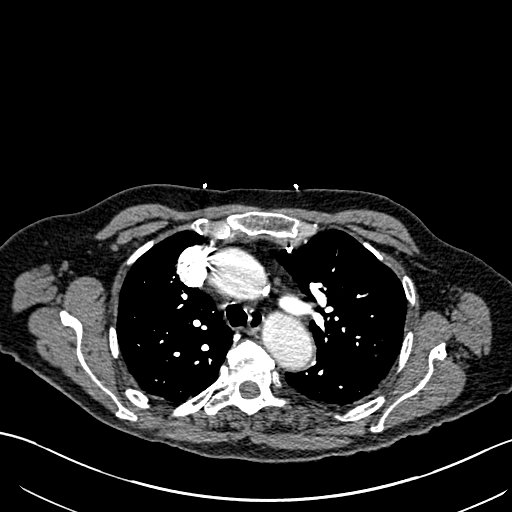
[im 150/215  lung]
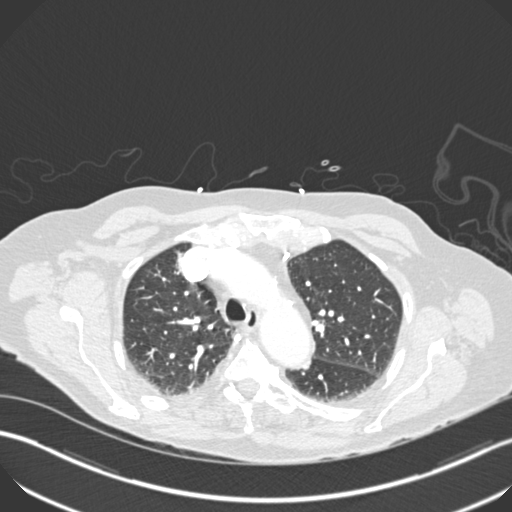
[im 161/215  mediastinal]
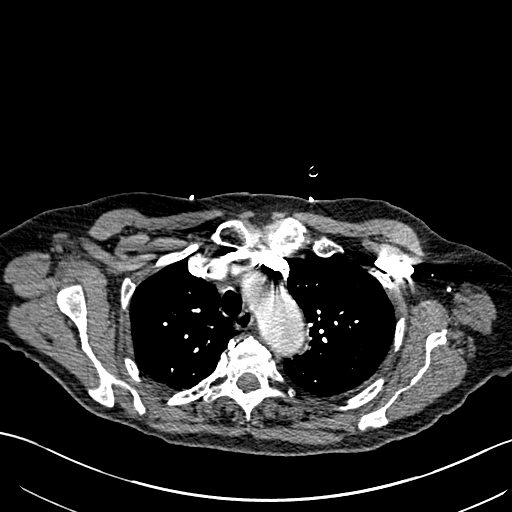
[im 182/215  lung]
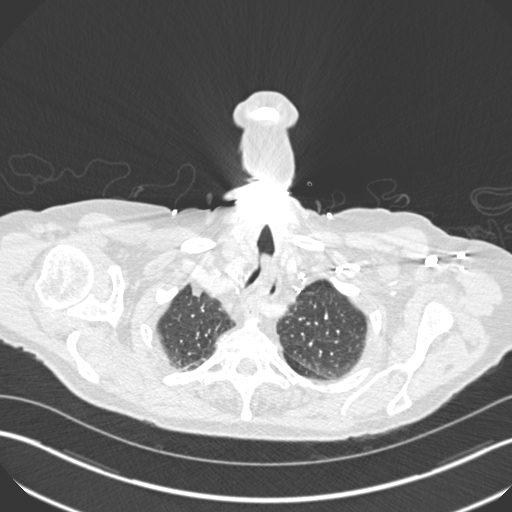
[im 193/215  mediastinal]
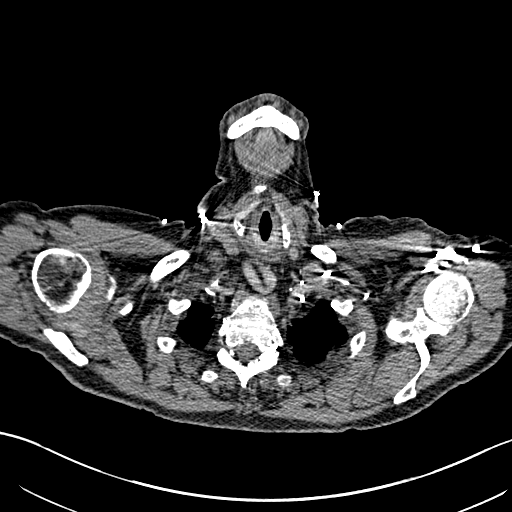
[im 204/215  lung]
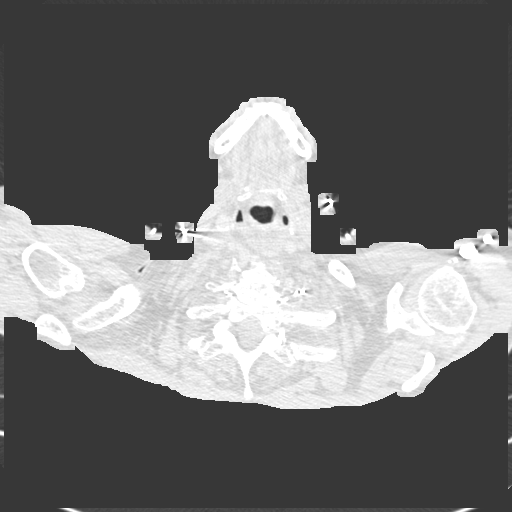

[19 of 32 positions shown; findings below may reference images not displayed]

FINDINGS: No evidence of pulmonary embolism.

Mild subpleural nodularity in the anterior right upper lobe,
possibly pleural parenchymal scarring (series 12/image 11).
Dependent atelectasis in the bilateral lower lobes.  No pleural
effusion or pneumothorax.

Mild cardiomegaly.  No pericardial effusion.  Coronary
atherosclerosis.  Mild atherosclerotic calcifications of the aortic
arch.

Enlargement of the bilateral pulmonary arteries, suggesting
pulmonary arterial hypertension.

No suspicious mediastinal, hilar, or axillary lymphadenopathy.

Status post left mastectomy.

Visualized upper abdomen is unremarkable.

Degenerative changes of the visualized thoracolumbar spine.
IMPRESSION: No evidence of pulmonary embolism.

Mild cardiomegaly.

Suspected pulmonary arterial hypertension.

## 2013-06-23 ENCOUNTER — Ambulatory Visit: Payer: Medicare Other | Admitting: Radiation Oncology

## 2013-08-28 ENCOUNTER — Encounter: Payer: Self-pay | Admitting: Radiation Oncology

## 2013-08-28 ENCOUNTER — Other Ambulatory Visit (HOSPITAL_COMMUNITY)
Admission: RE | Admit: 2013-08-28 | Discharge: 2013-08-28 | Disposition: A | Discharge status: Home / Self Care | Payer: Medicare Other | Source: Ambulatory Visit | Attending: Radiation Oncology | Admitting: Radiation Oncology

## 2013-08-28 ENCOUNTER — Ambulatory Visit
Admission: RE | Admit: 2013-08-28 | Discharge: 2013-08-28 | Disposition: A | Discharge status: Home / Self Care | Payer: Medicare Other | Source: Ambulatory Visit | Attending: Radiation Oncology | Admitting: Radiation Oncology

## 2013-08-28 VITALS — BP 148/92 | HR 115 | Temp 98.1°F | Ht 63.0 in | Wt 149.4 lb

## 2013-08-28 DIAGNOSIS — C55 Malignant neoplasm of uterus, part unspecified: Secondary | ICD-10-CM

## 2013-08-28 DIAGNOSIS — Z124 Encounter for screening for malignant neoplasm of cervix: Secondary | ICD-10-CM | POA: Insufficient documentation

## 2013-08-28 NOTE — Progress Notes (Signed)
Radiation Oncology         [phone]) [phone] ________________________________  Name: Kaylee Pugh MRN: [id number]  Date: 08/28/2013  DOB: Apr 22, 1925  Follow-Up Visit Note  CC: Crist Infante, MD  Diagnosis:   Stage III-C uterine carcinosarcoma  Interval Since Last Radiation:   1 year and 5 months  Narrative:  The patient returns today for routine follow-up.  Other than general joint aches and discomfort in her feet she seems to be doing reasonably well. She has noticed some swelling in her left knee and will be seeing her primary care physician on February 25  for further evaluation of this issue. She was able to walk in to the clinic today.  She denies any pelvic pain vaginal bleeding or rectal bleeding. She denies any flank pain or breathing problems.  She missed her followup appointment in December so is behind her followup schedule by about 2 months.                             ALLERGIES:  is allergic to iodine.  Meds: Current Outpatient Prescriptions  Medication Sig Dispense Refill  . acetaminophen (TYLENOL) 500 MG tablet Take 500 mg by mouth every 6 (six) hours as needed. Pain      . ALPRAZolam (XANAX) 0.25 MG tablet Take 0.25 mg by mouth at bedtime as needed.       . B Complex-Folic Acid (B COMPLEX-VITAMIN B12) TABS Take 1 tablet by mouth daily.  100 tablet  prn  . Calcium Carbonate-Vitamin D (CALCIUM + D) 600-200 MG-UNIT TABS Take 2 tablets by mouth at bedtime.       Marland Kitchen diltiazem (CARDIZEM CD) 180 MG 24 hr capsule Take 180 mg by mouth daily.      . furosemide (LASIX) 20 MG tablet       . HYDROcodone-acetaminophen (VICODIN) 5-500 MG per tablet Take 1 tablet by mouth every 6 (six) hours as needed.      Marland Kitchen levothyroxine (SYNTHROID, LEVOTHROID) 50 MCG tablet Take 50 mcg by mouth daily.      Marland Kitchen omeprazole (PRILOSEC) 20 MG capsule Take 20 mg by mouth Daily.      . Probiotic Product (PROBIOTIC DAILY PO) Take 1 capsule by mouth. OTC      . Rivaroxaban (XARELTO) 20 MG TABS  tablet Take 20 mg by mouth daily with supper.      . traMADol (ULTRAM) 50 MG tablet Take by mouth every 6 (six) hours as needed.       No current facility-administered medications for this encounter.    Physical Findings: The patient is in no acute distress. Patient is alert and oriented.  height is 5\' 3"  (1.6 m) and weight is 149 lb 6.4 oz (67.767 kg). Her temperature is 98.1 F (36.7 C). Her blood pressure is 148/92 and her pulse is 115. Her oxygen saturation is 99%. .  No palpable supraclavicular or axillary adenopathy. The lungs are clear to auscultation. The heart has a regular rhythm and rate. The abdomen is soft and nontender with normal bowel sounds. There is no inguinal adenopathy appreciated. On pelvic examination the external genitalia are unremarkable. Speculum exam is performed. There radiation changes noted in the proximal vagina but no mucosal lesions noted. A Pap smear was obtained of the proximal vagina.   on bimanual and rectovaginal examination there no pelvic masses appreciated.  Lab Findings: Lab Results  Component Value Date   WBC 10.6* 08/28/2012  HGB 10.6* 08/28/2012   HCT 32.7* 08/28/2012   MCV 80.6 08/28/2012   PLT 201 08/28/2012     Radiographic Findings: No results found.  Impression:  No evidence of recurrence on clinical exam today, Pap smear pending  Plan:  Routine followup in 6 months. Patient in the interim will be seen by gynecologic oncology.  ____________________________________ Blair Promise, MD

## 2013-08-28 NOTE — Progress Notes (Signed)
Kaylee Pugh here for follow up after treatment to her pelvis.  She denies pain today, bladder issues, diarrhea, vaginal and rectal bleeding.  She reports fatigue.  She also reports pain in her left knee when walking.  She says it started this week.  Her left knee is swollen which she said started this morning.

## 2013-09-02 ENCOUNTER — Telehealth: Payer: Self-pay | Admitting: Oncology

## 2013-09-02 NOTE — Telephone Encounter (Signed)
Called [place] and informed her of the good results on her pap smear per Dr. Sondra Come.

## 2013-10-09 ENCOUNTER — Telehealth: Payer: Self-pay | Admitting: *Deleted

## 2013-10-09 ENCOUNTER — Telehealth: Payer: Self-pay | Admitting: Oncology

## 2013-10-09 NOTE — Telephone Encounter (Signed)
Received call from pt requesting an appt with Dr. Marko Plume.  Pt was last seen by md in 2014.  Pt stated she saw her PCP and was instructed to call her oncologist for an appt due to enlarged lymph nodes on both side of neck.  Pt denied pain at neck area.   Instructed pt to have her PCP send office notes to our office for Dr. Marko Plume to review.  Gave pt fax number to triage.  Informed pt that message will be relayed to md. Pt's  Phone   [phone].

## 2013-10-09 NOTE — Telephone Encounter (Signed)
, °

## 2013-10-15 ENCOUNTER — Other Ambulatory Visit: Payer: Self-pay | Admitting: Oncology

## 2013-10-15 DIAGNOSIS — C55 Malignant neoplasm of uterus, part unspecified: Secondary | ICD-10-CM

## 2013-10-15 NOTE — Progress Notes (Signed)
Medical Oncology  Request from PCP [facility] [place] [phone]) requesting return visit to this MD due to adenopathy in neck. Office note from 09-01-13 received; I have called back to that office now requesting their recent labs be faxed to this office (LM for [facility]) POF sent to schedulers for visit ~ 4-24 + lab.  Godfrey Pick, MD

## 2013-10-16 ENCOUNTER — Telehealth: Payer: Self-pay | Admitting: Oncology

## 2013-10-16 NOTE — Telephone Encounter (Signed)
Kaylee Pugh and advised on April appt...Pugh ok and aware

## 2013-11-02 ENCOUNTER — Other Ambulatory Visit: Payer: Self-pay | Admitting: Oncology

## 2013-11-03 ENCOUNTER — Ambulatory Visit (HOSPITAL_BASED_OUTPATIENT_CLINIC_OR_DEPARTMENT_OTHER): Payer: Medicare Other | Admitting: Oncology

## 2013-11-03 ENCOUNTER — Other Ambulatory Visit (HOSPITAL_BASED_OUTPATIENT_CLINIC_OR_DEPARTMENT_OTHER): Payer: Medicare Other

## 2013-11-03 ENCOUNTER — Telehealth: Payer: Self-pay | Admitting: Oncology

## 2013-11-03 ENCOUNTER — Encounter: Payer: Self-pay | Admitting: Oncology

## 2013-11-03 ENCOUNTER — Telehealth: Payer: Self-pay

## 2013-11-03 VITALS — BP 160/77 | HR 98 | Temp 97.8°F | Resp 20 | Ht 63.0 in | Wt 144.3 lb

## 2013-11-03 DIAGNOSIS — I4891 Unspecified atrial fibrillation: Secondary | ICD-10-CM

## 2013-11-03 DIAGNOSIS — C55 Malignant neoplasm of uterus, part unspecified: Secondary | ICD-10-CM

## 2013-11-03 DIAGNOSIS — I10 Essential (primary) hypertension: Secondary | ICD-10-CM

## 2013-11-03 LAB — CBC WITH DIFFERENTIAL/PLATELET
BASO%: 0.3 % (ref 0.0–2.0)
BASOS ABS: 0 10*3/uL (ref 0.0–0.1)
EOS%: 0.6 % (ref 0.0–7.0)
Eosinophils Absolute: 0 10*3/uL (ref 0.0–0.5)
HEMATOCRIT: 36.7 % (ref 34.8–46.6)
HEMOGLOBIN: 11.5 g/dL — AB (ref 11.6–15.9)
LYMPH%: 13.8 % — AB (ref 14.0–49.7)
MCH: 24.3 pg — AB (ref 25.1–34.0)
MCHC: 31.4 g/dL — AB (ref 31.5–36.0)
MCV: 77.5 fL — AB (ref 79.5–101.0)
MONO#: 0.6 10*3/uL (ref 0.1–0.9)
MONO%: 9.8 % (ref 0.0–14.0)
NEUT#: 4.6 10*3/uL (ref 1.5–6.5)
NEUT%: 75.5 % (ref 38.4–76.8)
Platelets: 262 10*3/uL (ref 145–400)
RBC: 4.73 10*6/uL (ref 3.70–5.45)
RDW: 18.3 % — ABNORMAL HIGH (ref 11.2–14.5)
WBC: 6.1 10*3/uL (ref 3.9–10.3)
lymph#: 0.8 10*3/uL — ABNORMAL LOW (ref 0.9–3.3)

## 2013-11-03 LAB — COMPREHENSIVE METABOLIC PANEL (CC13)
ALK PHOS: 87 U/L (ref 40–150)
ALT: 19 U/L (ref 0–55)
AST: 28 U/L (ref 5–34)
Albumin: 4 g/dL (ref 3.5–5.0)
Anion Gap: 10 mEq/L (ref 3–11)
BILIRUBIN TOTAL: 0.37 mg/dL (ref 0.20–1.20)
BUN: 13.8 mg/dL (ref 7.0–26.0)
CALCIUM: 10 mg/dL (ref 8.4–10.4)
CO2: 24 mEq/L (ref 22–29)
CREATININE: 0.8 mg/dL (ref 0.6–1.1)
Chloride: 101 mEq/L (ref 98–109)
Glucose: 116 mg/dl (ref 70–140)
Potassium: 4.7 mEq/L (ref 3.5–5.1)
Sodium: 135 mEq/L — ABNORMAL LOW (ref 136–145)
Total Protein: 6.7 g/dL (ref 6.4–8.3)

## 2013-11-03 LAB — LACTATE DEHYDROGENASE (CC13): LDH: 197 U/L (ref 125–245)

## 2013-11-03 NOTE — Telephone Encounter (Signed)
per pof sch appt/give pt dates for Dr Skeet Latch In may & Dr Sondra Come in August-cld pt no answer willl mail to home

## 2013-11-03 NOTE — Telephone Encounter (Signed)
, °

## 2013-11-03 NOTE — Telephone Encounter (Signed)
Mailed a copy of Kaylee Pugh cbc/diff/c-met/LDH  To her home address as listed in EMR as requested by patient per Dr. Marko Plume. A copy of these labs was also electronically sent to Olena Heckle PA> in [place].

## 2013-11-03 NOTE — Progress Notes (Signed)
OFFICE PROGRESS NOTE   11/03/2013   Physicians:J.Soper/ W.Skeet Latch, J.Kinard, M.Mahoney (PCP, [place] [phone])/ PA Aldean Ast, Truman Hayward (cardiology, [facility]), O'Neal (GI, [facility]), O'Toole (pain clinic Ledell Noss), S.MacDiarmid    INTERVAL HISTORY:  Patient is seen, for first time back at this office since 08-2012, at request of PCP office for question of cervical adenopathy. Since she was here last, she has been followed by Drs Sondra Come and Skeet Latch for IIIC uterine carcinosarcoma, continuing observation since surgery radiation and chemotherapy all completed 04-2012. Last CT AP was 06-2012. She is to see Dr Skeet Latch 11-13-13 and Dr Sondra Come next 02-19-14.  She also has remote history of early stage left breast cancer treated with left mastectomy without axillary node evaluation in 2002, with no adjuvant therapy. Patient felt the areas beneath lower jaw and brought to attention of PA; these are not tender or uncomfortable. She occasionally has difficulty swallowing either liquids or solids, last time "a few weeks ago". She is not aware of other areas of adenopathy, has had no fever and no regular night sweats. Appetite is much better and energy is at baseline given Afib and CHF. She has no known active dental problems.  Otherwise she does not seem to have symptoms of concern from standpoint of uterine malignancy or that treatment. She denies abdominal or pelvic pain, LE edema, bleeding.   She does not have PAC.  Son drove her to office today.   ONCOLOGIC HISTORY History is of vaginal spotting beginning Dec 2012, with gyn evaluation delayed because of orthopedic problems then. She saw gynecologist in Feb 2013, with endometrial biopsy revealing endometrial cancer, tho specimen was scant. She was seen in consultation by Dr.Paola Alycia Rossetti 09-27-2011 and had surgery by Dr.Soper at [facility] 10-06-2011, which was robotic hysterectomy/BSO/ bilateral pelvic lymphadenectomy (periaortic nodes not  visualized). [facility] [phone]) from 10-06-11 showed carcinosarcoma, heterologous type with rhabdomyosarcomatous differentiation, FIGO grade 3, near transmural invasion in anterior and posterior lower uterine segment (less than 1 mm from serosal surface). 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 With high risk for recurrence in upper abdomen and for distant recurrence, Dr Clarene Essex recommended taxane + carboplatin given in sandwich fashion with RT; taxotere was chosen in preference to taxol due to preexisting peripheral neuropathy. Cycle 1 Norma Fredrickson /taxotere was given on 11-22-11 and cycle 2 with same drugs on 12-13-11. She was hospitalized in [place] after cycle 1 chemotherapy with symptomatic Afib with RVR. Attempts at cardioversion were not successful; she is being followed by cardiology in [place]. With the Afib, she was initially on xarelto, changed to ASA particularly due to thrombocytopenia with chemotherapy. Performance status and peripheral neuropathy by cycle 3 did not allow taxotere, with carboplatin only used for remainder of chemotherapy. She was treated with the chemotherapy in sandwich fashion with RT, completing 4500 cGY external beam RT in [place] then HDR x3 in [place] thru 03-26-12. She had 3 additional cycles of carboplatin thru 04-30-12, even the single agent carboplatin difficult for her to tolerate. She had restaging CT AP in [facility] system 06-19-12, with no findings of concern for active or progressive malignancy. She saw Dr Skeet Latch [phone] and Dr Sondra Come last 08-2013, plan to alternate visits with those MDs every 3 months.   Review of systems as above, also: Pain in toes related to joint deformities and neuropathy, a little better on gabapentin but  affecting balance and activity. Gabapentin helping neuropathy pain; she  cancelled visit to orthopedist last week "there is nothing he can do". Taste still not normal. No bleeding. Bladder unchanged and still up to void multiple times per night, which she tells me she prefers to taking another medication. Occasional dry mouth. Occasional SOB. Up most of day at home, able to work a little outside. Bowels are moving regularly and well on probiotic. Remainder of 10 point Review of Systems negative.  Objective:  Vital signs in last 24 hours:  BP 160/77  Pulse 98  Temp(Src) 97.8 F (36.6 C) (Oral)  Resp 20  Ht 5\' 3"  (1.6 m)  Wt 144 lb 4.8 oz (65.454 kg)  BMI 25.57 kg/m2 Weight is down 5 lbs from 08-28-13. Alert, oriented and appropriate. Ambulatory without assistance, not wearing orthotic shoes. Respirations not labored RA>.    HEENT:PERRL, sclerae not icteric. Oral mucosa moist without lesions, posterior pharynx with dull erythema bilaterally consistent with post nasal drainage, no exudate. No obvious active dental problems. Bilateral symmetrical fullness below lower jaw anteriorly, seems to be submandibular glands. Neck supple. No JVD.  Lymphatics:no cervical,suraclavicular, axillary or inguinal adenopathy Resp: clear to auscultation bilaterally and normal percussion bilaterally Cardio: irregularly irregular rate and rhythm. No gallop. Clear heart sounds. GI: soft, nontender, not distended, no mass or organomegaly. Normally active bowel sounds. Surgical incision not remarkable. Musculoskeletal/ Extremities: without pitting edema, cords, tenderness Neuro: no peripheral neuropathy. Otherwise nonfocal Skin without rash, ecchymosis, petechiae Breasts: right breast without dominant mass, skin or nipple findings. Left mastectomy scar without evidence of local recurrence.  Axillae benign.   Lab Results:  Results for orders placed in visit on 11/03/13  CBC WITH DIFFERENTIAL      Result Value Ref Range   WBC 6.1  3.9 - 10.3 10e3/uL   NEUT# 4.6  1.5 - 6.5 10e3/uL    HGB 11.5 (*) 11.6 - 15.9 g/dL   HCT 36.7  34.8 - 46.6 %   Platelets 262  145 - 400 10e3/uL   MCV 77.5 (*) 79.5 - 101.0 fL   MCH 24.3 (*) 25.1 - 34.0 pg   MCHC 31.4 (*) 31.5 - 36.0 g/dL   RBC 4.73  3.70 - 5.45 10e6/uL   RDW 18.3 (*) 11.2 - 14.5 %   lymph# 0.8 (*) 0.9 - 3.3 10e3/uL   MONO# 0.6  0.1 - 0.9 10e3/uL   Eosinophils Absolute 0.0  0.0 - 0.5 10e3/uL   Basophils Absolute 0.0  0.0 - 0.1 10e3/uL   NEUT% 75.5  38.4 - 76.8 %   LYMPH% 13.8 (*) 14.0 - 49.7 %   MONO% 9.8  0.0 - 14.0 %   EOS% 0.6  0.0 - 7.0 %   BASO% 0.3  0.0 - 2.0 %   CMET available after visit normal with exception of Na 135,  Including K 4.7, glu 116, creat 0.8, LFTs normal, protein 6.7, alb 4.0. LDH also normal at 197.  Studies/Results:  No results found.  Medications: I have reviewed the patient's current medications.   Pharmacy now [facility] in [place]  DISCUSSION: I have reassured patient that I do not find any adenopathy of concern and that otherwise I am pleased with how well she is doing. She will keep appointments with Drs Skeet Latch and Sondra Come as shceduled and I will see her back in ~ 6 months, which patient requests.  Assessment/Plan: Uterine carcinosarcoma: history as above, clinically doing well. She will follow up with Drs Skeet Latch and Sondra Come every  3 months as scheduled. I will see her back this fall with labs, tho she can cancel or change this appointment if something is needed differently closer to that time.  2.Atrial fibrillation with RVR, controlled now with medication and on xarelto 3.No significant adenopathy on exam and nothing of concern by history or labs today. Reassured. 3. History of degenerative back and neck problems: under care of pain clinic in [place]  4.HTN, elevated lipids  5.vision problems OS around time of onset gyn symptoms  6. left mastectomy WITHOUT axillary node removal for early stage breast cancer 2002. She no longer has mammograms, exam ok today. 7. Urinary frequency  especially at night x years, with sleep difficulty related. She did not want interventions by urology 8.improvement in foot ulcer which was treated by wound center.    Patient had questions answered to her satisfaction and understands that I will be in touch with her PCP by this note.    Gordy Levan, MD   11/03/2013, 9:43 AM

## 2013-11-10 ENCOUNTER — Telehealth: Payer: Self-pay | Admitting: Gynecologic Oncology

## 2013-11-10 NOTE — Telephone Encounter (Signed)
Office Visit  Note: Gyn-Onc  Elson Clan 80 y.o. female  CC:  Surveillance uterine carcinosarcoma  Assessment/Plan: 80 y.o. with stage IIIC1 uterine carcinosarcoma. Minimally invasive endometrial cancer staging was performed by Dr. Cindie Laroche in March of 2013. She since receive 3 cycles of Taxotere and carboplatin therapy external beam radiation therapy and brachytherapy completed March 26, 2012.  She then received 3 cycles of single agent carboplatin.   Followup with Dr. Sondra Come in 3 months F/U with Dr. Marko Plume in 6 months  Followup with GYN oncology in 9 months  The patient is without any evidence of disease.   She reports intermittent significant bilateral lower extremity edeam.  The swollen There is notable lower extremity edema of her ankles and feet.  She states that  She is on lasix 40mg  daily without any diuresis.  [place] contacted Aldean Ast  NP and she will see the patient as assess for cardiac failure.  HPI: Patient is an 80 year old gravida 4 para 4 who began having some bleeding December 23 of the year 2012.  An endometrial biopsy 08/2011 office revealed endometrial cancer.The specimen was scant and fragmented and they were not able to give her final classification or grading.   10/06/2011 underwent [place] BSO BPLND at [facility] by Dr. Clarene Essex at [facility].  Path Diagnosis:  A:Histologic type: Carcinosarcoma (malignant mixed M llerian tumor), heterologous type with rhabdomyosarcomatous differentiation (see comment) Histologic grade: FIGO grade 3 Tumor site: Primary endometrial cancer Myometrial invasion: Near transmural invasion in the anterior and posterior lower uterine segment, 1.5 cm in depth, less than 1 mm from the serosa Serosal involvement: Extremely close, less than 1 mm to serosal surface Cervical involvement: - Not involved by the endometrial carcinosarcoma. - High grade squamous intraepithelial lesion, cervical intraepithelial neoplasia grade 3 (CIN 3) is present in multiple  sections from the cervix with extension into superficial endocervical glands, margins are uninvolved Other involved sites: left pelvic lymph nodes Lymphovascular space invasion: Present Regional lymph nodes (see other specimens B and C): Total number involved: 2  Total number examined: 13 FIGO (2009 classification) Stage Grouping: IIIC1  The patient has received three cycles of Taxoterel/Carboplatin.  CT scan Abd/Pelvis 12/2011 without evidence of residual disease. She subsequently received external beam pelvic radiotherapy and vaginal brachytherapy that was completed in September of 2013. She then received 3 additional cycles of carboplatin.  . Of note carboplatin was poorly tolerated. A CT of the abdomen and pelvis performed in December of 2030 without any evidence of progressive disease. Interval History:  Erabella Kuipers intermittent BLE edema.  At the last visit it was significant ans dopler studies were ordered that were negative.  She was referred to lympedema clinic given the h/oPLND and XRT.  The patient did not f/u.  On examination today there is no significant edema.  The patient reports that lasix 40 daily intermittently causes diuresis.    Social Hx:    Son is engaged in her life.  Does her ADL  Past Surgical Hx:  Consistent of a bladder tacking for urinary incontinence. She is a gravida 4 para 4. At mastectomy for an early stage breast cancer in 2002 and did not require any additional chemotherapy radiation or adjuvant hormonal management.  Past Surgical History  Procedure Laterality Date  . Cataract extraction  1987    Left eye  . Cataract extraction  2002    Right eye  . Retinal detachment surgery  2000  . Bladder repair  2001  . Tubal ligation  1966  . Appendectomy  1966  . Mastectomy  2002    left  . Abdominal hysterectomy    . Tee without cardioversion  12/01/2011    Procedure: TRANSESOPHAGEAL ECHOCARDIOGRAM (TEE);  Surgeon: Birdie Riddle, MD;  Location: [facility];  Service: Cardiovascular;  Laterality: N/A;  . Cardioversion  12/01/2011    Procedure: CARDIOVERSION;  Surgeon: Birdie Riddle, MD;  Location[facility] ENDOSCOPY;  Service: Cardiovascular;  Laterality: N/A;    Past Medical Hx:  Past Medical History  Diagnosis Date  . Breast cancer   . Hypertension   . Hyperlipidemia   . Thyroid disease   . Osteoporosis   . Post-menopausal bleeding   . 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 Low sodium     hx of x 2 per pt and son  . Radiation 01/30/12-03/05/12    6300 cGy external beam radiation, pelvis, vaginal cuff  . Radiation     Brachytherapy 1800 cGy intracavitary  . History of chemotherapy     Taxotere/carboplatin  . Uterine cancer   . Atrial fibrillation    Pap test June 2014 within normal limits  REVIEW OF SYSTEMS Constitutional  Feels well, but experiences significant fatigue, has an excellent appetite.  Cardiovascular  No chest pain,intermittent significant  bilateral lower extremity edema  Pulmonary  No cough or wheeze no hemoptysis Gastro Intestinal  No nausea, vomitting, or diarrhoea. Denies rectal bleeding  Genito Urinary  No frequency, urgency, dysuria.  Reports nocturia longstanding. States that diuretics have very little effect and often do not cause diuresis. Musculo Skeletal  Occasional  Myalgia, intermittent significant edema of the lower extremities. Neurologic  No weakness, reports numbness of the feet and hands lonstanding, no change in gait Psychology  No depression, anxiety, insomnia.    Vitals:  Blood pressure 110/74, pulse 74, temperature 98.8 F (37.1 C), resp. rate 16, height 5\' 3"  (1.6 m), weight 145 lb 8 oz (65.998 kg).  Physical Exam: Nourished well developed elderly female in no acute distress. Patient appears younger than stated age.  Neck: No lymphadenopathy no thyromegaly.  Lungs: Clear to auscultation bilaterally.  Cardiovascular: Irregular regular rate  and rhythm.  Abdomen: Well-healed infraumbilical vertical incision. There is no evidence of an incisional hernias at the trocar sites. She has palpable 2 cm umbilical hernia that is not tender and reducible. There are no masses or nodularity.  Lymph Nodes No cervical supraclavicular or inguinal lymphadenopathy.  Back:  No CVAT  Pelvic: External genitalia within normal limits,  The vagina is markedly atrophic. Left paravaginal sidewall prolapse.  Telangectasia at the vaginal cuff.  mThe cuff is intact, no nodularity in the cul de sac  There is no nodularity. There are cul de sac masses.   Rectal: Good tone no masses  Extremities: 1+ left lower extremity edema 1+ right lower extremity edema.

## 2013-11-13 ENCOUNTER — Encounter: Payer: Self-pay | Admitting: Gynecologic Oncology

## 2013-11-13 ENCOUNTER — Ambulatory Visit: Payer: Medicare Other | Attending: Gynecologic Oncology | Admitting: Gynecologic Oncology

## 2013-11-13 VITALS — BP 128/74 | HR 79 | Temp 97.7°F | Resp 18 | Ht 63.0 in | Wt 144.4 lb

## 2013-11-13 DIAGNOSIS — Z9221 Personal history of antineoplastic chemotherapy: Secondary | ICD-10-CM | POA: Insufficient documentation

## 2013-11-13 DIAGNOSIS — E079 Disorder of thyroid, unspecified: Secondary | ICD-10-CM | POA: Insufficient documentation

## 2013-11-13 DIAGNOSIS — E785 Hyperlipidemia, unspecified: Secondary | ICD-10-CM | POA: Insufficient documentation

## 2013-11-13 DIAGNOSIS — C541 Malignant neoplasm of endometrium: Secondary | ICD-10-CM

## 2013-11-13 DIAGNOSIS — Z8542 Personal history of malignant neoplasm of other parts of uterus: Secondary | ICD-10-CM | POA: Insufficient documentation

## 2013-11-13 DIAGNOSIS — M810 Age-related osteoporosis without current pathological fracture: Secondary | ICD-10-CM | POA: Insufficient documentation

## 2013-11-13 DIAGNOSIS — Z923 Personal history of irradiation: Secondary | ICD-10-CM | POA: Insufficient documentation

## 2013-11-13 DIAGNOSIS — I4891 Unspecified atrial fibrillation: Secondary | ICD-10-CM | POA: Insufficient documentation

## 2013-11-13 DIAGNOSIS — Z09 Encounter for follow-up examination after completed treatment for conditions other than malignant neoplasm: Secondary | ICD-10-CM | POA: Insufficient documentation

## 2013-11-13 DIAGNOSIS — I10 Essential (primary) hypertension: Secondary | ICD-10-CM | POA: Insufficient documentation

## 2013-11-13 DIAGNOSIS — Z853 Personal history of malignant neoplasm of breast: Secondary | ICD-10-CM | POA: Insufficient documentation

## 2013-11-13 NOTE — Progress Notes (Signed)
Office Visit  Note: Gyn-Onc  Elson Clan 80 y.o. female  CC:  Surveillance uterine carcinosarcoma  Assessment/Plan: 80 y.o. with stage IIIC1 uterine carcinosarcoma. Minimally invasive endometrial cancer staging was performed by Dr. Cindie Laroche in March of 2013. She since receive 3 cycles of Taxotere and carboplatin therapy external beam radiation therapy and brachytherapy completed March 26, 2012.  She then received 3 cycles of single agent carboplatin.   Followup with Dr. Sondra Come in 3 months F/U with Dr. Marko Plume in 6 months  Followup with GYN oncology in 9 months  The patient is without any evidence of disease.   Periorbital hemorrhage. Hit her head on a pipe in the garden earlier this week.  Feels well, no HA no visual or neurological  changes, the area is non tender.  Advised that Williams Che is nonreversible and the consequences can be catastrophic.  Advised to make her family aware of any injuries immediately.   HPI: Patient is an 80 year old gravida 4 para 4 who began having some bleeding December 23 of the year 2012.  An endometrial biopsy 08/2011 office revealed endometrial cancer.The specimen was scant and fragmented and they were not able to give her final classification or grading.   10/06/2011 underwent [place] BSO BPLND at [facility] by Dr. Clarene Essex at [facility].  Path Diagnosis:  A:Histologic type: Carcinosarcoma (malignant mixed M llerian tumor), heterologous type with rhabdomyosarcomatous differentiation (see comment) Histologic grade: FIGO grade 3 Tumor site: Primary endometrial cancer Myometrial invasion: Near transmural invasion in the anterior and posterior lower uterine segment, 1.5 cm in depth, less than 1 mm from the serosa Serosal involvement: Extremely close, less than 1 mm to serosal surface Cervical involvement: - Not involved by the endometrial carcinosarcoma. - High grade squamous intraepithelial lesion, cervical intraepithelial neoplasia grade 3 (CIN 3) is present in  multiple sections from the cervix with extension into superficial endocervical glands, margins are uninvolved Other involved sites: left pelvic lymph nodes Lymphovascular space invasion: Present Regional lymph nodes (see other specimens B and C): Total number involved: 2  Total number examined: 13 FIGO (2009 classification) Stage Grouping: IIIC1  The patient has received three cycles of Taxoterel/Carboplatin.  CT scan Abd/Pelvis 12/2011 without evidence of residual disease. She subsequently received external beam pelvic radiotherapy and vaginal brachytherapy that was completed in September of 2013. She then received 3 additional cycles of carboplatin.  . Of note carboplatin was poorly tolerated. A CT of the abdomen and pelvis performed in December of 2030 without any evidence of progressive disease. Interval History:  Corbin Hott intermittent BLE edema.  At the last visit it was significant ans dopler studies were ordered that were negative.  She was referred to lympedema clinic given the h/oPLND and XRT.  The patient did not f/u.  On examination today there is no significant edema.  The patient reports that lasix 40 daily intermittently causes diuresis.    Social Hx:    Son is engaged in her life.  Does her ADL.  Plans for ziplining this weekend. Hit her right temporal area while gardening.  Did not alert her family about the even despite the knowledge that she in receiving anticoagulation because she is ok with dying and didn't wish to bother her family.  States that her affairs are in order..  Past Surgical Hx:  Consistent of a bladder tacking for urinary incontinence. She is a gravida 4 para 4. At mastectomy for an early stage breast cancer in 2002 and did not require any additional chemotherapy radiation  or adjuvant hormonal management.  Past Surgical History  Procedure Laterality Date  . Cataract extraction  1987    Left eye  . Cataract extraction  2002    Right eye  . Retinal  detachment surgery  2000  . Bladder repair  2001  . Tubal ligation  1966  . Appendectomy  1966  . Mastectomy  2002    left  . Abdominal hysterectomy    . Tee without cardioversion  12/01/2011    Procedure: TRANSESOPHAGEAL ECHOCARDIOGRAM (TEE);  Surgeon: Birdie Riddle, MD;  [facility];  Service: Cardiovascular;  Laterality: N/A;  . Cardioversion  12/01/2011    Procedure: CARDIOVERSION;  Surgeon: Birdie Riddle, MD;  Location: [facility] ENDOSCOPY;  Service: Cardiovascular;  Laterality: N/A;    Past Medical Hx:  Past Medical History  Diagnosis Date  . Breast cancer   . Hypertension   . Hyperlipidemia   . Thyroid disease   . Osteoporosis   . Post-menopausal bleeding   . 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 Low sodium     hx of x 2 per pt and son  . Radiation 01/30/12-03/05/12    6300 cGy external beam radiation, pelvis, vaginal cuff  . Radiation     Brachytherapy 1800 cGy intracavitary  . History of chemotherapy     Taxotere/carboplatin  . Uterine cancer   . Atrial fibrillation    Pap test June 2014 within normal limits  REVIEW OF SYSTEMS Constitutional  Feels well, has an excellent appetite.  Cardiovascular  No chest pain,intermittent significant  bilateral lower extremity edema  Pulmonary  No cough or wheeze no hemoptysis Gastro Intestinal  No nausea, vomitting, or diarrhoea. Denies rectal bleeding  Genito Urinary  No frequency, urgency, dysuria.  No vaginal bleeding, no change in bladder habits. Musculo Skeletal  Occasional  Myalgia, intermittent significant edema of the lower extremities. Hit her head on a pipe while pulling weeds in the garden two days ago. Neurologic  No weakness, reports numbness of the feet and hands since chemotherapy.  No headache, no visual changes, no change in gait or mentation Psychology  No depression, anxiety, insomnia.    Vitals:  Blood pressure 128/74, pulse 79, temperature 97.7 F (36.5 C),  temperature source Oral, resp. rate 18, height 5\' 3"  (1.6 m), weight 144 lb 6.4 oz (65.499 kg).  Physical Exam: Nourished well developed elderly female in no acute distress. Patient appears younger than stated age.  HEENT:  Right rocoons eye and right temporal  Hematoma.  Non tender to examination.  No ridges.  Neck: No lymphadenopathy no thyromegaly.  Lungs: Clear to auscultation bilaterally.  Cardiovascular: Irregular regular rate and rhythm.  Abdomen: Well-healed infraumbilical vertical incision. There is no evidence of an incisional hernias at the trocar sites. She has palpable 2 cm umbilical hernia that is not tender and reducible. There are no masses or nodularity.  Lymph Nodes No cervical supraclavicular or inguinal lymphadenopathy.  Back:  No CVAT  Pelvic: External genitalia within normal limits,  The vagina is markedly atrophic. Left paravaginal sidewall prolapse.  Telangectasia at the vaginal cuff.  o nodularity in the cul de sac    Rectal: Good tone no masses  Extremities:No clubbing cyanosis or edema

## 2013-11-13 NOTE — Patient Instructions (Signed)
Followup with Dr. Sondra Come in 3 months F/U with Dr. Marko Plume in 6 months  Followup with GYN oncology in 9 months   Thank you very much Ms. Kaylee Pugh for allowing me to provide care for you today.  I appreciate your confidence in choosing our Gynecologic Oncology team.  If you have any questions about your visit today please call our office and we will get back to you as soon as possible.  Francetta Found. Caleyah Jr MD., PhD Gynecologic Oncology

## 2014-02-18 ENCOUNTER — Encounter: Payer: Self-pay | Admitting: Radiation Oncology

## 2014-02-18 NOTE — Progress Notes (Signed)
Tried calling patient to remind her of 10:40a appt w/Dr. Pablo Ledger tomorrow, but kept getting a busy tone.

## 2014-02-19 ENCOUNTER — Ambulatory Visit
Admission: RE | Admit: 2014-02-19 | Discharge: 2014-02-19 | Disposition: A | Discharge status: Home / Self Care | Payer: Medicare Other | Source: Ambulatory Visit | Attending: Radiation Oncology | Admitting: Radiation Oncology

## 2014-02-19 ENCOUNTER — Encounter: Payer: Self-pay | Admitting: Radiation Oncology

## 2014-02-19 ENCOUNTER — Other Ambulatory Visit (HOSPITAL_COMMUNITY)
Admission: RE | Admit: 2014-02-19 | Discharge: 2014-02-19 | Disposition: A | Discharge status: Home / Self Care | Payer: Medicare Other | Source: Ambulatory Visit | Attending: Radiation Oncology | Admitting: Radiation Oncology

## 2014-02-19 VITALS — BP 130/68 | HR 77 | Temp 97.6°F | Ht 63.0 in | Wt 145.2 lb

## 2014-02-19 DIAGNOSIS — C55 Malignant neoplasm of uterus, part unspecified: Secondary | ICD-10-CM

## 2014-02-19 DIAGNOSIS — R87619 Unspecified abnormal cytological findings in specimens from cervix uteri: Secondary | ICD-10-CM | POA: Insufficient documentation

## 2014-02-19 DIAGNOSIS — Z124 Encounter for screening for malignant neoplasm of cervix: Secondary | ICD-10-CM | POA: Diagnosis present

## 2014-02-19 DIAGNOSIS — Z1151 Encounter for screening for human papillomavirus (HPV): Secondary | ICD-10-CM | POA: Diagnosis present

## 2014-02-19 NOTE — Progress Notes (Signed)
Kaylee Pugh here for follow up after treatment for uterine cancer.  She denies pain.  She reports feeling dizzy and had a fall in May where she hit her head on a metal post.  She did not see a doctor for this fall.  She denies headaches and blurred vision.  She appeared unsteady when walking to the clinic.  She reports getting up 3-4 times a night to urinate.  She reports seeing a urologist for this last year.  She is also taking lasix which contributes to the frequency.  She denies bowel issues, rectal/vaginal bleeding and nausea.  She reports fatigue with activity.

## 2014-02-19 NOTE — Progress Notes (Signed)
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  Narrative:  The patient returns today for routine follow-up.  She is doing well except for some issues related to dizziness. She will be seeing her primary care physician in the next several days to address this issue. She denies any pain in the pelvis area, hematuria,  vaginal bleeding or rectal bleeding.                              ALLERGIES:  is allergic to iodine.  Meds: Current Outpatient Prescriptions  Medication Sig Dispense Refill  . ALPRAZolam (XANAX) 0.25 MG tablet Take 0.25 mg by mouth at bedtime as needed.       . B Complex-Folic Acid (B COMPLEX-VITAMIN B12) TABS Take 1 tablet by mouth daily.  100 tablet  prn  . Calcium Carbonate-Vitamin D (CALCIUM + D) 600-200 MG-UNIT TABS Take 2 tablets by mouth at bedtime.       Marland Kitchen diltiazem (CARDIZEM CD) 180 MG 24 hr capsule Take 180 mg by mouth daily.      . furosemide (LASIX) 20 MG tablet       . gabapentin (NEURONTIN) 100 MG capsule Take 100 mg by mouth 3 (three) times daily.      Marland Kitchen HYDROcodone-acetaminophen (VICODIN) 5-500 MG per tablet Take 1 tablet by mouth every 6 (six) hours as needed.      Marland Kitchen levothyroxine (SYNTHROID, LEVOTHROID) 50 MCG tablet Take 50 mcg by mouth daily.      Marland Kitchen omeprazole (PRILOSEC) 20 MG capsule Take 20 mg by mouth Daily.      . Probiotic Product (PROBIOTIC DAILY PO) Take 1 capsule by mouth. OTC      .  Rivaroxaban (XARELTO) 20 MG TABS tablet Take 20 mg by mouth daily with supper.      . traMADol (ULTRAM) 50 MG tablet Take by mouth every 6 (six) hours as needed.      Marland Kitchen acetaminophen (TYLENOL) 500 MG tablet Take 500 mg by mouth every 6 (six) hours as needed. Pain       No current facility-administered medications for this encounter.    Physical Findings: The patient is in no acute distress. Patient is alert and oriented.  height is 5\' 3"  (1.6 m) and weight is 145 lb 3.2 oz (65.862 kg). Her oral temperature is 97.6 F (36.4 C). Her blood pressure is 130/68 and her pulse is 77. Marland Kitchen  No palpable supraclavicular or axillary adenopathy. The lungs are clear to auscultation. The heart has regular rhythm and rate. The abdomen is soft and nontender with normal bowel sounds. No inguinal adenopathy is appreciated. The patient has a hernia along the umbilical area which is easily reducible. The patient has had discomfort in this area and is considering surgery for this issue. On pelvic examination the external genitalia are unremarkable. A speculum exam is performed. No mucosal  lesions are noted in the vaginal vault. A Pap smear was obtained of the proximal vagina. On bimanual and rectovaginal examination there no pelvic masses appreciated.  Lab Findings: Lab Results  Component Value Date   WBC 6.1 11/03/2013   HGB 11.5* 11/03/2013   HCT 36.7 11/03/2013   MCV 77.5* 11/03/2013   PLT 262 11/03/2013      Radiographic Findings: No results found.  Impression:  No evidence of recurrence on clinical exam today, Pap smear pending  Plan:  Routine followup in 6 months. In the interim the patient will be seen by gynecologic oncology  ____________________________________ Blair Promise, MD

## 2014-02-23 LAB — CYTOLOGY - PAP

## 2014-02-27 ENCOUNTER — Telehealth: Payer: Self-pay | Admitting: Oncology

## 2014-02-27 ENCOUNTER — Telehealth: Payer: Self-pay | Admitting: Radiation Oncology

## 2014-02-27 NOTE — Telephone Encounter (Signed)
Kaylee Pugh left a message asking about her pap smear results.

## 2014-02-27 NOTE — Telephone Encounter (Signed)
x

## 2014-05-17 ENCOUNTER — Other Ambulatory Visit: Payer: Self-pay | Admitting: Oncology

## 2014-05-20 ENCOUNTER — Ambulatory Visit (HOSPITAL_COMMUNITY)
Admission: RE | Admit: 2014-05-20 | Discharge: 2014-05-20 | Disposition: A | Discharge status: Home / Self Care | Payer: Medicare Other | Source: Ambulatory Visit | Attending: Oncology | Admitting: Oncology

## 2014-05-20 ENCOUNTER — Telehealth: Payer: Self-pay | Admitting: *Deleted

## 2014-05-20 ENCOUNTER — Encounter: Payer: Self-pay | Admitting: Oncology

## 2014-05-20 ENCOUNTER — Telehealth: Payer: Self-pay | Admitting: Oncology

## 2014-05-20 ENCOUNTER — Ambulatory Visit (HOSPITAL_BASED_OUTPATIENT_CLINIC_OR_DEPARTMENT_OTHER): Payer: Medicare Other | Admitting: Oncology

## 2014-05-20 ENCOUNTER — Other Ambulatory Visit (HOSPITAL_BASED_OUTPATIENT_CLINIC_OR_DEPARTMENT_OTHER): Payer: Medicare Other

## 2014-05-20 VITALS — BP 128/77 | HR 71 | Temp 97.6°F | Resp 18 | Ht 63.0 in | Wt 149.7 lb

## 2014-05-20 DIAGNOSIS — Z853 Personal history of malignant neoplasm of breast: Secondary | ICD-10-CM | POA: Diagnosis not present

## 2014-05-20 DIAGNOSIS — I771 Stricture of artery: Secondary | ICD-10-CM | POA: Diagnosis not present

## 2014-05-20 DIAGNOSIS — C55 Malignant neoplasm of uterus, part unspecified: Secondary | ICD-10-CM | POA: Diagnosis not present

## 2014-05-20 DIAGNOSIS — R0602 Shortness of breath: Secondary | ICD-10-CM | POA: Diagnosis present

## 2014-05-20 DIAGNOSIS — I4891 Unspecified atrial fibrillation: Secondary | ICD-10-CM | POA: Diagnosis present

## 2014-05-20 DIAGNOSIS — R0609 Other forms of dyspnea: Secondary | ICD-10-CM

## 2014-05-20 DIAGNOSIS — R350 Frequency of micturition: Secondary | ICD-10-CM

## 2014-05-20 LAB — CBC WITH DIFFERENTIAL/PLATELET
BASO%: 0.5 % (ref 0.0–2.0)
Basophils Absolute: 0 10*3/uL (ref 0.0–0.1)
EOS ABS: 0.1 10*3/uL (ref 0.0–0.5)
EOS%: 1.1 % (ref 0.0–7.0)
HEMATOCRIT: 36.2 % (ref 34.8–46.6)
HEMOGLOBIN: 11.2 g/dL — AB (ref 11.6–15.9)
LYMPH#: 0.8 10*3/uL — AB (ref 0.9–3.3)
LYMPH%: 18.4 % (ref 14.0–49.7)
MCH: 24.2 pg — ABNORMAL LOW (ref 25.1–34.0)
MCHC: 31 g/dL — AB (ref 31.5–36.0)
MCV: 78 fL — ABNORMAL LOW (ref 79.5–101.0)
MONO#: 0.5 10*3/uL (ref 0.1–0.9)
MONO%: 11.9 % (ref 0.0–14.0)
NEUT%: 68.1 % (ref 38.4–76.8)
NEUTROS ABS: 3.1 10*3/uL (ref 1.5–6.5)
PLATELETS: 247 10*3/uL (ref 145–400)
RBC: 4.64 10*6/uL (ref 3.70–5.45)
RDW: 18.3 % — ABNORMAL HIGH (ref 11.2–14.5)
WBC: 4.6 10*3/uL (ref 3.9–10.3)

## 2014-05-20 LAB — COMPREHENSIVE METABOLIC PANEL (CC13)
ALT: 23 U/L (ref 0–55)
ANION GAP: 9 meq/L (ref 3–11)
AST: 29 U/L (ref 5–34)
Albumin: 4.1 g/dL (ref 3.5–5.0)
Alkaline Phosphatase: 94 U/L (ref 40–150)
BUN: 12.9 mg/dL (ref 7.0–26.0)
CALCIUM: 10.2 mg/dL (ref 8.4–10.4)
CHLORIDE: 101 meq/L (ref 98–109)
CO2: 27 meq/L (ref 22–29)
CREATININE: 1 mg/dL (ref 0.6–1.1)
GLUCOSE: 152 mg/dL — AB (ref 70–140)
Potassium: 4.4 mEq/L (ref 3.5–5.1)
SODIUM: 137 meq/L (ref 136–145)
TOTAL PROTEIN: 6.9 g/dL (ref 6.4–8.3)
Total Bilirubin: 0.71 mg/dL (ref 0.20–1.20)

## 2014-05-20 NOTE — Telephone Encounter (Signed)
per pof to [facility] apptadv [facility] not open-gave pt appt to Georges Mouse to sch for [place]-pt going to [facility] for CXR

## 2014-05-20 NOTE — Progress Notes (Signed)
OFFICE PROGRESS NOTE   05/20/2014   Physicians:KayleeSoper/ W.Skeet Kaylee Pugh, KayleePugh, KayleePugh (PCP, [place] [phone])/ PA Kaylee Pugh, Kaylee Pugh (cardiology, Kaylee Pugh), Kaylee Pugh (GI, Kaylee Pugh), Kaylee Pugh (pain clinic Kaylee Pugh), KayleePugh   INTERVAL HISTORY:  Patient is an 80 yo laday who is seen, together with son Kaylee Pugh,  alternating follow up visits with gyn oncology, radiation oncology and this office, on observation for IIIC carcinosarcoma of uterus since completing adjuvant radiation and chemotherapy 04-2012. She has multiple other comorbidities, including chronic Afib on xarelto prophylactically,  followed by PCP and new cardiologist in [address]. She saw Dr Skeet Kaylee Pugh in May 2015 and will see her again in Feb 2016; she saw Dr Sondra Come in August 2015. She has history of left breast cancer early stage at mastectomy without axillary node evaluation in 2002, no adjuvant treatment. She no longer has mammograms done. I have not received information from PCP or cardiology since last visit to me.  Patient complains of increased SOB with exertion for past +/- few months, with some increase in LE swelling recently. She is not SOB at rest and denies cough or chest pain. The LE swelling has improved in past with diuresis and is some better after elevation overnight. She has uncomfortable callous on feet. Balance is not good in part related to foot discomfort and weight of legs with swelling. Appetite is very good. Bowels are moving regularly. She denies abdominal or pelvic pain.   No PAC She has had flu vaccine.   ONCOLOGIC HISTORY History is of vaginal spotting beginning Dec 2012, with gyn evaluation delayed because of orthopedic problems then. She saw gynecologist in Feb 2013, with endometrial biopsy revealing endometrial cancer, tho specimen was scant. She was seen in consultation by Dr.Paola Alycia Rossetti 09-27-2011 and had surgery by Dr[facility]Soper at [facility] 10-06-2011, which was robotic hysterectomy/BSO/  bilateral pelvic lymphadenectomy (periaortic nodes not visualized). [facility] [id number]) from 10-06-11 showed carcinosarcoma, heterologous type with rhabdomyosarcomatous differentiation, FIGO grade 3, near transmural invasion in anterior and posterior lower uterine segment (less than 1 mm from serosal surface). 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 With high risk for recurrence in upper abdomen and for distant recurrence, Dr Clarene Essex recommended taxane + carboplatin given in sandwich fashion with RT; taxotere was chosen in preference to taxol due to preexisting peripheral neuropathy. Cycle 1 Kaylee Pugh /taxotere was given on 11-22-11 and cycle 2 with same drugs on 12-13-11. She was hospitalized in [place] after cycle 1 chemotherapy with symptomatic Afib with RVR. Attempts at cardioversion were not successful; she is being followed by cardiology in [place]. With the Afib, she was initially on xarelto, changed to ASA particularly due to thrombocytopenia with chemotherapy. Performance status and peripheral neuropathy by cycle 3 did not allow taxotere, with carboplatin only used for remainder of chemotherapy. She was treated with the chemotherapy in sandwich fashion with RT, completing 4500 cGY external beam RT in [place] then HDR x3 in [place] thru 03-26-12. She had 3 additional cycles of carboplatin thru 04-30-12, even the single agent carboplatin difficult for her to tolerate. She had restaging CT AP in [facility] system 06-19-12, with no findings of concern for active or progressive malignancy. She saw Dr Skeet Kaylee Pugh [phone] and Dr Sondra Come last 08-2013, plan to alternate visits with those MDs every 3 months.   Review of systems as above, also: No recent infectious illness, no fever. Spends much  of time sitting at home as mobility is limited. No  bleeding. No noted changes in breast exam. Remainder of 10 point Review of Systems negative.  Objective:  Vital signs in last 24 hours:  BP 128/77 mmHg  Pulse 71  Temp(Src) 97.6 F (36.4 C) (Oral)  Resp 18  Ht 5\' 3"  (1.6 m)  Wt 149 lb 11.2 oz (67.903 kg)  BMI 26.52 kg/m2  Weight up 4 lbs. Elderly, looks in general about as I have seen her before, respirations not labored RA, wearing orthopedic shoes. Alert, oriented and appropriate. Ambulatory with minimal assistance. Son very attentive and helpful. No alopecia  HEENT:PERRL, sclerae not icteric. Oral mucosa moist without lesions, posterior pharynx clear.  Neck supple. No JVD.  Lymphatics:no cervical,suraclavicular, axillary adenopathy Resp: clear to auscultation bilaterally and normal percussion bilaterally Cardio: irregularly irregular rate and rhythm.  GI: soft, nontender, not distended, no appreciable mass or organomegaly. Normally active bowel sounds. Surgical incision not remarkable. Musculoskeletal/ Extremities: lower legs with 1+ edema, no cords or tenderness Neuro: no peripheral neuropathy. Otherwise nonfocal Skin without rash, ecchymosis. Minimal  Petechiae anterior lower legs . Breasts: left mastectomy scar not remarkable, right breast without dominant mass, skin or nipple findings. Axillae benign.   Lab Results:  Results for orders placed or performed in visit on 05/20/14  CBC with Differential  Result Value Ref Range   WBC 4.6 3.9 - 10.3 10e3/uL   NEUT# 3.1 1.5 - 6.5 10e3/uL   HGB 11.2 (L) 11.6 - 15.9 g/dL   HCT 36.2 34.8 - 46.6 %   Platelets 247 145 - 400 10e3/uL   MCV 78.0 (L) 79.5 - 101.0 fL   MCH 24.2 (L) 25.1 - 34.0 pg   MCHC 31.0 (L) 31.5 - 36.0 g/dL   RBC 4.64 3.70 - 5.45 10e6/uL   RDW 18.3 (H) 11.2 - 14.5 %   lymph# 0.8 (L) 0.9 - 3.3 10e3/uL   MONO# 0.5 0.1 - 0.9 10e3/uL   Eosinophils Absolute 0.1 0.0 - 0.5 10e3/uL   Basophils Absolute 0.0 0.0 - 0.1 10e3/uL   NEUT% 68.1 38.4 - 76.8 %   LYMPH% 18.4  14.0 - 49.7 %   MONO% 11.9 0.0 - 14.0 %   EOS% 1.1 0.0 - 7.0 %   BASO% 0.5 0.0 - 2.0 %    CMET normal with exception of fasting blood sugar of 152, including Na 137 and creatinine 1.0 Copies of CBC and CMET given to patient now.  Studies/Results: patient sent from office for CXR due to SOB complaints: EXAM: CHEST 2 VIEW   05-20-14  COMPARISON: PA and lateral chest x-ray of Nov 29, 2011.  FINDINGS: The lungs are well-expanded and clear. The heart and pulmonary vascularity are normal. There is tortuosity of the descending thoracic aorta. There is prominent thoracolumbar levoscoliosis. There is no pleural effusion or pneumothorax.  IMPRESSION: There is no evidence of pneumonia nor CHF. Mild hyperinflation may be voluntary or may reflect underlying COPD or reactive airway disease.   Medications: I have reviewed the patient's current medications. She requests change in diuretic, which I have deferred to PCP/ cardiology  DISCUSSION: with exam and CXR information, it seems most likely that SOB is related to ongoing Afib and deconditioning. She will follow up with PCP and cardiology. As she is 65, has multiple significant medical problems and with unremarkable right breast physical exam, I agree with no routine mammograms, even with remote left breast cancer history. Son is in agreement and patient is reassured with  this discussion. Discussed prophylactic anticoagulation with Afib, and choice of xarelto.  Assessment/Plan: 1.Uterine carcinosarcoma: history as above, no clinical concerns now and CXR ok. She will follow up with Drs Skeet Kaylee Pugh and Sondra Come as scheduled. I can see her back in 9-12 months, or just prn. 2.Atrial fibrillation with RVR, controlled now with medication and on xarelto 3.bilateral pedal edema: has improved with adjustments in diuretics previously. Encouraged elevation of legs and she will follow up with PCP early Dec or sooner.  3. History of degenerative back and  neck problems: under care of pain clinic in [place]  4.HTN, elevated lipids  5.vision problems OS around time of onset gyn symptoms  6. left mastectomy WITHOUT axillary node removal for early stage breast cancer 2002. She no longer has mammograms, see discussion above. 7. Urinary frequency especially at night x years, with sleep difficulty related. She did not want interventions by urology 8.previous foot ulcer treated at wound center in [place] area    Time spent 25 min including >50% counseling and coordination of care   LIVESAY,LENNIS P, MD   05/20/2014, 9:36 AM

## 2014-05-20 NOTE — Telephone Encounter (Signed)
-----   Message from Gordy Levan, MD sent at 05/20/2014  2:49 PM EST ----- Labs seen and need follow up: please let her know that the CXR looks fine - no congestive heart failure and nothing of concern for cancer in lungs, so most likely this SOB is related to her atrial fibrillation. Please get this report to her PCP,  Aldean Ast NP with [facility] in [place], phone [phone] and fax [phone]

## 2014-05-20 NOTE — Telephone Encounter (Signed)
Called and spoke with pt as noted below by Dr. Marko Plume. Faxed chest x-ray to Aldean Ast to fax number noted below.

## 2014-07-08 ENCOUNTER — Telehealth: Payer: Self-pay | Admitting: *Deleted

## 2014-07-08 NOTE — Telephone Encounter (Signed)
Patient called needing to schedule an appt with Dr. Skeet Latch in February. Pt agreeable to come 08/20/14 at 9:15am. Dr. Sondra Come appt rescheduled to May by Santiago Glad in rad onc. Patient given new appt for May 2016 as well. Told patient to please call us back if she needs to reschedule for any reason.

## 2014-08-13 ENCOUNTER — Ambulatory Visit: Payer: Medicare Other | Admitting: Radiation Oncology

## 2014-08-18 ENCOUNTER — Telehealth: Payer: Self-pay | Admitting: Gynecologic Oncology

## 2014-08-18 NOTE — Telephone Encounter (Signed)
Office Visit  Note: Gyn-Onc  Kaylee Pugh 81 y.o. female  CC:  Surveillance uterine carcinosarcoma  Assessment/Plan: 81 y.o. with stage IIIC1 uterine carcinosarcoma. Minimally invasive endometrial cancer staging was performed by Dr. Cindie Laroche in March of 2013. She since receive 3 cycles of Taxotere and carboplatin therapy external beam radiation therapy and brachytherapy completed March 26, 2012.  She then received 3 cycles of single agent carboplatin.   Followup with Dr. Sondra Come in 3 months F/U with Dr. Marko Plume in 6 months  Followup with GYN oncology in 9 months  Kaylee Pugh without any evidence of disease.   Periorbital hemorrhage. Hit her head on a pipe in the garden earlier this week.  Feels well, no HA no visual or neurological  changes, the area is non tender.  Advised that Kaylee Pugh is nonreversible and the consequences can be catastrophic.  Advised to make her family aware of any injuries immediately.   HPI: Patient is an 81 year old gravida 4 para 4 who began having some bleeding December 23 of the year 2012.  An endometrial biopsy 08/2011 office revealed endometrial cancer.The specimen was scant and fragmented and they were not able to give her final classification or grading.   10/06/2011 underwent [place] BSO BPLND [facility] by Dr. Clarene Essex at [facility].  Path Diagnosis:  A:Histologic type: Carcinosarcoma (malignant mixed M llerian tumor), heterologous type with rhabdomyosarcomatous differentiation (see comment) Histologic grade: FIGO grade 3 Tumor site: Primary endometrial cancer Myometrial invasion: Near transmural invasion in the anterior and posterior lower uterine segment, 1.5 cm in depth, less than 1 mm from the serosa Serosal involvement: Extremely close, less than 1 mm to serosal surface Cervical involvement: - Not involved by the endometrial carcinosarcoma. - High grade squamous intraepithelial lesion, cervical intraepithelial neoplasia grade 3 (CIN 3) is present in  multiple sections from the cervix with extension into superficial endocervical glands, margins are uninvolved Other involved sites: left pelvic lymph nodes Lymphovascular space invasion: Present Regional lymph nodes (see other specimens B and C): Total number involved: 2  Total number examined: 13 FIGO (2009 classification) Stage Grouping: IIIC1  The patient has received three cycles of Taxoterel/Carboplatin.  CT scan Abd/Pelvis 12/2011 without evidence of residual disease. She subsequently received external beam pelvic radiotherapy and vaginal brachytherapy that was completed in September of 2013. She then received 3 additional cycles of carboplatin.  . Of note carboplatin was poorly tolerated. A CT of the abdomen and pelvis performed in December of 2030 without any evidence of progressive disease.  Kaylee Pugh intermittent BLE edema.  At the last visit it was significant ans dopler studies were ordered that were negative.  She was referred to lympedema clinic given the h/oPLND and XRT.  The patient did not f/u.  On examination today there is no significant edema.  The patient reports that lasix 40 daily intermittently causes diuresis.    Pap 02/2014 ASCUS + HPV -  Social Hx:    Son is engaged in her life.  Does her ADL.  Plans for ziplining this weekend. Hit her right temporal area while gardening.  Did not alert her family about the even despite the knowledge that she in receiving anticoagulation because she is ok with dying and didn't wish to bother her family.  States that her affairs are in order..  Past Surgical Hx:  Consistent of a bladder tacking for urinary incontinence. She is a gravida 4 para 4. At mastectomy for an early stage breast cancer in 2002 and did not require  any additional chemotherapy radiation or adjuvant hormonal management.  Past Surgical History  Procedure Laterality Date  . Cataract extraction  1987    Left eye  . Cataract extraction  2002    Right eye  .  Retinal detachment surgery  2000  . Bladder repair  2001  . Tubal ligation  1966  . Appendectomy  1966  . Mastectomy  2002    left  . Abdominal hysterectomy    . Tee without cardioversion  12/01/2011    Procedure: TRANSESOPHAGEAL ECHOCARDIOGRAM (TEE);  Surgeon: Birdie Riddle, MD;  Location: [facility];  Service: Cardiovascular;  Laterality: N/A;  . Cardioversion  12/01/2011    Procedure: CARDIOVERSION;  Surgeon: Birdie Riddle, MD;  Location: [facility] ENDOSCOPY;  Service: Cardiovascular;  Laterality: N/A;    Past Medical Hx:  Past Medical History  Diagnosis Date  . Breast cancer   . Hypertension   . Hyperlipidemia   . Thyroid disease   . Osteoporosis   . Post-menopausal bleeding   . 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 Low sodium     hx of x 2 per pt and son  . Radiation 01/30/12-03/05/12    6300 cGy external beam radiation, pelvis, vaginal cuff  . Radiation     Brachytherapy 1800 cGy intracavitary  . History of chemotherapy     Taxotere/carboplatin  . Uterine cancer   . Atrial fibrillation    Pap test June 2014 within normal limits  REVIEW OF SYSTEMS Constitutional  Feels well, has an excellent appetite.  Cardiovascular  No chest pain,intermittent significant  bilateral lower extremity edema  Pulmonary  No cough or wheeze no hemoptysis Gastro Intestinal  No nausea, vomitting, or diarrhoea. Denies rectal bleeding  Genito Urinary  No frequency, urgency, dysuria.  No vaginal bleeding, no change in bladder habits. Musculo Skeletal  Occasional  Myalgia, intermittent significant edema of the lower extremities. Hit her head on a pipe while pulling weeds in the garden two days ago. Neurologic  No weakness, reports numbness of the feet and hands since chemotherapy.  No headache, no visual changes, no change in gait or mentation Psychology  No depression, anxiety, insomnia.    Vitals:  Blood pressure 128/74, pulse 79, temperature 97.7 F  (36.5 C), temperature source Oral, resp. rate 18, height 5\' 3"  (1.6 m), weight 144 lb 6.4 oz (65.499 kg).  Physical Exam: Nourished well developed elderly female in no acute distress. Patient appears younger than stated age.  HEENT:  Right rocoons eye and right temporal  Hematoma.  Non tender to examination.  No ridges.  Neck: No lymphadenopathy no thyromegaly.  Lungs: Clear to auscultation bilaterally.  Cardiovascular: Irregular regular rate and rhythm.  Abdomen: Well-healed infraumbilical vertical incision. There is no evidence of an incisional hernias at the trocar sites. She has palpable 2 cm umbilical hernia that is not tender and reducible. There are no masses or nodularity.  Lymph Nodes No cervical supraclavicular or inguinal lymphadenopathy.  Back:  No CVAT  Pelvic: External genitalia within normal limits,  The vagina is markedly atrophic. Left paravaginal sidewall prolapse.  Telangectasia at the vaginal cuff.  o nodularity in the cul de sac    Rectal: Good tone no masses  Extremities:No clubbing cyanosis or edema

## 2014-08-20 ENCOUNTER — Encounter: Payer: Self-pay | Admitting: Gynecologic Oncology

## 2014-08-20 ENCOUNTER — Ambulatory Visit: Payer: Medicare Other | Attending: Gynecologic Oncology | Admitting: Gynecologic Oncology

## 2014-08-20 VITALS — BP 129/67 | HR 74 | Temp 99.3°F | Resp 18 | Ht 63.0 in | Wt 150.6 lb

## 2014-08-20 DIAGNOSIS — Z8542 Personal history of malignant neoplasm of other parts of uterus: Secondary | ICD-10-CM | POA: Diagnosis not present

## 2014-08-20 DIAGNOSIS — C55 Malignant neoplasm of uterus, part unspecified: Secondary | ICD-10-CM | POA: Diagnosis present

## 2014-08-20 DIAGNOSIS — L02419 Cutaneous abscess of limb, unspecified: Secondary | ICD-10-CM

## 2014-08-20 DIAGNOSIS — M79671 Pain in right foot: Secondary | ICD-10-CM

## 2014-08-20 DIAGNOSIS — L03115 Cellulitis of right lower limb: Secondary | ICD-10-CM

## 2014-08-20 DIAGNOSIS — Z923 Personal history of irradiation: Secondary | ICD-10-CM | POA: Insufficient documentation

## 2014-08-20 DIAGNOSIS — C541 Malignant neoplasm of endometrium: Secondary | ICD-10-CM

## 2014-08-20 DIAGNOSIS — L03119 Cellulitis of unspecified part of limb: Secondary | ICD-10-CM

## 2014-08-20 MED ORDER — CEPHALEXIN 500 MG PO CAPS: 500.0000 mg | ORAL_CAPSULE | Freq: Two times a day (BID) | ORAL | Status: DC

## 2014-08-20 NOTE — Progress Notes (Signed)
Office Visit  Note: Gyn-Onc  Kaylee Pugh 81 y.o. female  CC:  Surveillance uterine carcinosarcoma  Assessment/Plan: 81 y.o. with stage IIIC1 uterine carcinosarcoma. Minimally invasive endometrial cancer staging was performed by Dr. Cindie Laroche in March of 2013. She since receive 3 cycles of Taxotere and carboplatin therapy external beam radiation therapy and brachytherapy completed March 26, 2012.  She then received 3 cycles of single agent carboplatin.  Kaylee Pugh without any evidence of disease.  Followup with Dr. Sondra Come in 3 months F/U with Dr. Marko Plume in 6 months  Followup with GYN oncology in 9 months  RLE cellulitis Keflex x 1 week RX Advised to share this finding with her podiatrist tomorrow as well as the complaint of the painful mass on the r role. Advised to call if erythema expands outside the marked area.     HPI: Patient is an 81 year old gravida 4 para 4 who began having some bleeding December 23 of the year 2012.  An endometrial biopsy 08/2011 office revealed endometrial cancer.The specimen was scant and fragmented and they were not able to give her final classification or grading.   10/06/2011 underwent Noble BSO BPLND at [facility] by Dr. Clarene Essex at [facility].  Path Diagnosis:  A:Histologic type: Carcinosarcoma (malignant mixed M llerian tumor), heterologous type with rhabdomyosarcomatous differentiation (see comment) Histologic grade: FIGO grade 3 Tumor site: Primary endometrial cancer Myometrial invasion: Near transmural invasion in the anterior and posterior lower uterine segment, 1.5 cm in depth, less than 1 mm from the serosa Serosal involvement: Extremely close, less than 1 mm to serosal surface Cervical involvement: - Not involved by the endometrial carcinosarcoma. - High grade squamous intraepithelial lesion, cervical intraepithelial neoplasia grade 3 (CIN 3) is present in multiple sections from the cervix with extension into superficial endocervical glands, margins  are uninvolved Other involved sites: left pelvic lymph nodes Lymphovascular space invasion: Present Regional lymph nodes (see other specimens B and C): Total number involved: 2  Total number examined: 13 FIGO (2009 classification) Stage Grouping: IIIC1  The patient has received three cycles of Taxoterel/Carboplatin.  CT scan Abd/Pelvis 12/2011 without evidence of residual disease. She subsequently received external beam pelvic radiotherapy and vaginal brachytherapy that was completed in September of 2013. She then received 3 additional cycles of carboplatin.  . Of note carboplatin was poorly tolerated. A CT of the abdomen and pelvis performed in December of 2030 without any evidence of progressive disease.  Kaylee Pugh intermittent BLE edema that is stable. - has declined referral to lymphedema clinic. Reports difficuly ambulating over the past 2 days because of the callus on the sole of her R foot.  Also c/o redness of her RLE.  Had a surgical procedure on her right foot 4 weeks ago.  Has a visit with her podiatrist scheduled for tomorrow.  Pap 02/2014 ASCUS + HPV -  Social Hx:    Son is engaged in her life.  Does her ADL.  Enjoyed ziplining over Mother's day weekend.  Past Surgical Hx:  Consistent of a bladder tacking for urinary incontinence. She is a gravida 4 para 4. At mastectomy for an early stage breast cancer in 2002 and did not require any additional chemotherapy radiation or adjuvant hormonal management.  Past Surgical History  Procedure Laterality Date  . Cataract extraction  1987    Left eye  . Cataract extraction  2002    Right eye  . Retinal detachment surgery  2000  . Bladder repair  2001  . Tubal ligation  1966  . Appendectomy  1966  . Mastectomy  2002    left  . Abdominal hysterectomy    . Tee without cardioversion  12/01/2011    Procedure: TRANSESOPHAGEAL ECHOCARDIOGRAM (TEE);  Surgeon: Birdie Riddle, MD;  Location: [place];  Service: Cardiovascular;   Laterality: N/A;  . Cardioversion  12/01/2011    Procedure: CARDIOVERSION;  Surgeon: Birdie Riddle, MD;  Location: [facility] ENDOSCOPY;  Service: Cardiovascular;  Laterality: N/A;    Past Medical Hx:  Past Medical History  Diagnosis Date  . Breast cancer   . Hypertension   . Hyperlipidemia   . Thyroid disease   . Osteoporosis   . Post-menopausal bleeding   . 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 Low sodium     hx of x 2 per pt and son  . Radiation 01/30/12-03/05/12    6300 cGy external beam radiation, pelvis, vaginal cuff  . Radiation     Brachytherapy 1800 cGy intracavitary  . History of chemotherapy     Taxotere/carboplatin  . Uterine cancer   . Atrial fibrillation   Pap 02/19/2014 wnl.  REVIEW OF SYSTEMS Constitutional  Feels well, has lost 8 pounds recently. Cardiovascular  No chest pain,intermittent significant  bilateral lower extremity edema  Pulmonary  No cough or wheeze no hemoptysis Gastro Intestinal  No nausea, vomitting, or diarrhoea. Denies rectal bleeding  Genito Urinary  No frequency, urgency, dysuria.  No vaginal bleeding, no change in bladder habits. Musculo Skeletal  Swelling and erythema of the RLE for two days.  Difficulty ambulating because of the callus on her right sole. Neurologic  No weakness, reports numbness of the feet and hands since chemotherapy.  No headache, no visual changes, no change in mentation Psychology  No depression, anxiety, insomnia.    Vitals:  Blood pressure 129/67, pulse 74, temperature 99.3 F (37.4 C), temperature source Oral, resp. rate 18, height 5\' 3"  (1.6 m), weight 150 lb 9.6 oz (68.312 kg).  Physical Exam: Nourished well developed elderly female in no acute distress. Patient appears younger than stated age.  Neck: No lymphadenopathy no thyromegaly.  Lungs: Clear to auscultation bilaterally.  Cardiovascular: Irregular regular rate and rhythm.  Abdomen: Well-healed infraumbilical  vertical incision. There is no evidence of an incisional hernias at the trocar sites. She has palpable 2 cm umbilical hernia that is not tender and reducible. There are no masses or nodularity.  Lymph Nodes No cervical supraclavicular or inguinal lymphadenopathy.  Back:  No CVAT  Pelvic: External genitalia within normal limits,  The vagina is markedly atrophic and foreshotened. Left paravaginal sidewall prolapse.  Telangectasia at the vaginal cuff. No nodularity in the cul de sac    Rectal: Good tone no masses  Extremities: Rle swollen.  Medial aspect with blanching erythema, and war to tough.  Dorsal furface of the foot with an area of redness and warmth.  Both areas marked. Sole of right foot with a 2cm mildly tender callus.

## 2014-08-20 NOTE — Patient Instructions (Signed)
Cellulitis Cellulitis is an infection of the skin and the tissue beneath it. The infected area is usually red and tender. Cellulitis occurs most often in the arms and lower legs.  CAUSES  Cellulitis is caused by bacteria that enter the skin through cracks or cuts in the skin. The most common types of bacteria that cause cellulitis are staphylococci and streptococci. SIGNS AND SYMPTOMS   Redness and warmth.  Swelling.  Tenderness or pain.  Fever. DIAGNOSIS  Your health care provider can usually determine what is wrong based on a physical exam. Blood tests may also be done. TREATMENT  Treatment usually involves taking an antibiotic medicine. HOME CARE INSTRUCTIONS   Take your antibiotic medicine as directed by your health care provider. Finish the antibiotic even if you start to feel better.  Keep the infected arm or leg elevated to reduce swelling.  Apply a warm cloth to the affected area up to 4 times per day to relieve pain.  Take medicines only as directed by your health care provider.  Keep all follow-up visits as directed by your health care provider. SEEK MEDICAL CARE IF:   You notice red streaks coming from the infected area.  Your red area gets larger or turns dark in color.  Your bone or joint underneath the infected area becomes painful after the skin has healed.  Your infection returns in the same area or another area.  You notice a swollen bump in the infected area.  You develop new symptoms.  You have a fever. SEEK IMMEDIATE MEDICAL CARE IF:   You feel very sleepy.  You develop vomiting or diarrhea.  You have a general ill feeling (malaise) with muscle aches and pains. MAKE SURE YOU:   Understand these instructions.  Will watch your condition.  Will get help right away if you are not doing well or get worse. Document Released: 04/05/2005 Document Revised: 11/10/2013 Document Reviewed: 09/11/2011 [facility] Patient Information 2015 [place], [place].  This information is not intended to replace advice given to you by your health care provider. Make sure you discuss any questions you have with your health care provider.  Follow-up with Dr. Skeet Latch in 9 months

## 2014-08-28 ENCOUNTER — Telehealth: Payer: Self-pay | Admitting: Oncology

## 2014-08-28 NOTE — Telephone Encounter (Signed)
, °

## 2014-11-12 ENCOUNTER — Ambulatory Visit: Payer: Medicare Other | Admitting: Radiation Oncology

## 2014-11-26 ENCOUNTER — Other Ambulatory Visit (HOSPITAL_COMMUNITY)
Admission: RE | Admit: 2014-11-26 | Discharge: 2014-11-26 | Disposition: A | Discharge status: Home / Self Care | Payer: Medicare Other | Source: Ambulatory Visit | Attending: Radiation Oncology | Admitting: Radiation Oncology

## 2014-11-26 ENCOUNTER — Ambulatory Visit
Admission: RE | Admit: 2014-11-26 | Discharge: 2014-11-26 | Disposition: A | Discharge status: Home / Self Care | Payer: Medicare Other | Source: Ambulatory Visit | Attending: Radiation Oncology | Admitting: Radiation Oncology

## 2014-11-26 ENCOUNTER — Encounter: Payer: Self-pay | Admitting: Radiation Oncology

## 2014-11-26 VITALS — BP 123/64 | HR 73 | Temp 97.4°F | Resp 16 | Ht 63.0 in | Wt 147.1 lb

## 2014-11-26 DIAGNOSIS — C55 Malignant neoplasm of uterus, part unspecified: Secondary | ICD-10-CM

## 2014-11-26 DIAGNOSIS — Z1151 Encounter for screening for human papillomavirus (HPV): Secondary | ICD-10-CM | POA: Insufficient documentation

## 2014-11-26 DIAGNOSIS — R87619 Unspecified abnormal cytological findings in specimens from cervix uteri: Secondary | ICD-10-CM | POA: Insufficient documentation

## 2014-11-26 DIAGNOSIS — Z124 Encounter for screening for malignant neoplasm of cervix: Secondary | ICD-10-CM | POA: Diagnosis present

## 2014-11-26 NOTE — Progress Notes (Signed)
Kaylee Pugh here for follow up.  She reports having arthritis pain at a 6/10 today.  She reports having a "catch" in her right side that started on Saturday.  She denies having any bladder or bowel issues.  She denies having vaginal discharge/bleeding.  She reports fatigue with activity.  She reports a good appetite.  She reports she is not using her vaginal dilator.  BP 123/64 mmHg  Pulse 73  Temp(Src) 97.4 F (36.3 C) (Oral)  Resp 16  Ht 5\' 3"  (1.6 m)  Wt 147 lb 1.6 oz (66.724 kg)  BMI 26.06 kg/m2

## 2014-11-26 NOTE — Progress Notes (Signed)
Radiation Oncology         [phone] ________________________________  Name: Kaylee Pugh MRN: [id number]  Date: 11/26/2014  DOB: 10-Nov-1924  Follow-Up Visit Note  CC: Crist Infante, MD  Diagnosis:  Uterine carcinosarcoma   Primary site: Corpus Uteri - Adenosarcoma   Staging method: AJCC 7th Edition   Clinical free text: IIIC1   Clinical: (T1c, N1, M0)   Pathologic free text: IIIC1   Pathologic: (T1c, N1, M0)   Summary: (T1c, N1, M0)  Interval Since Last Radiation: 2 years and 8 months ,she completed postoperative pelvic radiation therapy and intracavitary brachytherapy treatments.  Narrative:  The patient returns today for routine follow-up.  Kaylee Pugh here for follow up. She reports having arthritis pain at a 6/10 today. She reports having a "catch" in her right side that started on Saturday. She denies having any bladder or bowel issues. She denies having vaginal discharge/bleeding. She reports fatigue with activity. She reports a good appetite. She reports she is not using her vaginal dilator. Denies rectal bleeding. Still morning from the death of her son who died of a heart attack                               ALLERGIES:  is allergic to iodine.  Meds: Current Outpatient Prescriptions  Medication Sig Dispense Refill  . ALPRAZolam (XANAX) 0.25 MG tablet Take 0.25 mg by mouth at bedtime as needed.       . B Complex-Folic Acid (B COMPLEX-VITAMIN B12) TABS Take 1 tablet by mouth daily.  100 tablet  prn  . Calcium Carbonate-Vitamin D (CALCIUM + D) 600-200 MG-UNIT TABS Take 2 tablets by mouth at bedtime.       Marland Kitchen diltiazem (CARDIZEM CD) 180 MG 24 hr capsule Take 180 mg by mouth daily.      . furosemide (LASIX) 20 MG tablet       . gabapentin (NEURONTIN) 100 MG capsule Take 100 mg by mouth 3 (three) times daily.      Marland Kitchen HYDROcodone-acetaminophen (VICODIN) 5-500 MG per tablet Take 1 tablet by mouth every 6 (six) hours as needed.      Marland Kitchen levothyroxine  (SYNTHROID, LEVOTHROID) 50 MCG tablet Take 50 mcg by mouth daily.      Marland Kitchen omeprazole (PRILOSEC) 20 MG capsule Take 20 mg by mouth Daily.      . Probiotic Product (PROBIOTIC DAILY PO) Take 1 capsule by mouth. OTC      . Rivaroxaban (XARELTO) 20 MG TABS tablet Take 20 mg by mouth daily with supper.      . traMADol (ULTRAM) 50 MG tablet Take by mouth every 6 (six) hours as needed.      Marland Kitchen acetaminophen (TYLENOL) 500 MG tablet Take 500 mg by mouth every 6 (six) hours as needed. Pain       No current facility-administered medications for this encounter.    Physical Findings: The patient is in no acute distress. Patient is alert and oriented. BP 123/64 mmHg  Pulse 73  Temp(Src) 97.4 F (36.3 C) (Oral)  Resp 16  Ht 5\' 3"  (1.6 m)  Wt 147 lb 1.6 oz (66.724 kg)  BMI 26.06 kg/m2. No palpable supraclavicular or axillary adenopathy. The lungs are clear to auscultation. The heart has regular rhythm and rate. The abdomen is soft and nontender with normal bowel sounds. No inguinal adenopathy is appreciated. The patient has a hernia along the umbilical area which  is easily reducible. The patient has had discomfort in this area and is considering surgery for this issue. On pelvic examination the external genitalia are unremarkable. A speculum exam is performed. No mucosal lesions are noted in the vaginal vault. A Pap smear was obtained of the proximal vagina. On bimanual and rectovaginal examination there no pelvic masses appreciated.  Lab Findings: Lab Results  Component Value Date   WBC 6.1 11/03/2013   HGB 11.5* 11/03/2013   HCT 36.7 11/03/2013   MCV 77.5* 11/03/2013   PLT 262 11/03/2013    Radiographic Findings: No results found.  Impression:  No evidence of recurrence on clinical exam today, Pap smear pending  Plan:  Routine followup in 6 months. In the interim the patient will be seen by gynecologic oncology.  This document serves as a record of services personally performed by Gery Pray, MD. It  was created on his behalf by Jeralene Peters, a trained medical scribe. The creation of this record is based on the scribe's personal observations and the provider's statements to them. This document has been checked and approved by the attending provider.      ____________________________________ Blair Promise, MD

## 2014-11-30 LAB — CYTOLOGY - PAP

## 2014-12-02 ENCOUNTER — Telehealth: Payer: Self-pay | Admitting: Oncology

## 2014-12-02 NOTE — Telephone Encounter (Signed)
Called [place] and advised her of the good results on her pap smear per Dr. Sondra Come.  Kaylee Pugh verbalized under standing and agreement.

## 2015-02-03 ENCOUNTER — Other Ambulatory Visit: Payer: Self-pay | Admitting: Oncology

## 2015-02-03 ENCOUNTER — Telehealth: Payer: Self-pay | Admitting: Oncology

## 2015-02-03 NOTE — Telephone Encounter (Signed)
Appointments moved per pof and patient is aware

## 2015-02-22 ENCOUNTER — Ambulatory Visit: Payer: Medicare Other | Admitting: Oncology

## 2015-02-22 ENCOUNTER — Other Ambulatory Visit: Payer: Medicare Other

## 2015-03-03 ENCOUNTER — Other Ambulatory Visit: Payer: Self-pay

## 2015-03-03 DIAGNOSIS — C55 Malignant neoplasm of uterus, part unspecified: Secondary | ICD-10-CM

## 2015-03-04 ENCOUNTER — Other Ambulatory Visit (HOSPITAL_BASED_OUTPATIENT_CLINIC_OR_DEPARTMENT_OTHER): Payer: Medicare Other

## 2015-03-04 ENCOUNTER — Encounter: Payer: Self-pay | Admitting: Oncology

## 2015-03-04 ENCOUNTER — Other Ambulatory Visit: Payer: Self-pay | Admitting: Oncology

## 2015-03-04 ENCOUNTER — Ambulatory Visit (HOSPITAL_BASED_OUTPATIENT_CLINIC_OR_DEPARTMENT_OTHER): Payer: Medicare Other | Admitting: Oncology

## 2015-03-04 VITALS — BP 128/70 | HR 74 | Temp 98.0°F | Resp 18 | Ht 63.0 in | Wt 143.4 lb

## 2015-03-04 DIAGNOSIS — R350 Frequency of micturition: Secondary | ICD-10-CM

## 2015-03-04 DIAGNOSIS — I4891 Unspecified atrial fibrillation: Secondary | ICD-10-CM

## 2015-03-04 DIAGNOSIS — C55 Malignant neoplasm of uterus, part unspecified: Secondary | ICD-10-CM

## 2015-03-04 DIAGNOSIS — I10 Essential (primary) hypertension: Secondary | ICD-10-CM | POA: Diagnosis not present

## 2015-03-04 LAB — CBC WITH DIFFERENTIAL/PLATELET
BASO%: 0.1 % (ref 0.0–2.0)
Basophils Absolute: 0 10*3/uL (ref 0.0–0.1)
EOS ABS: 0.1 10*3/uL (ref 0.0–0.5)
EOS%: 0.8 % (ref 0.0–7.0)
HEMATOCRIT: 35.9 % (ref 34.8–46.6)
HEMOGLOBIN: 11.5 g/dL — AB (ref 11.6–15.9)
LYMPH#: 1.4 10*3/uL (ref 0.9–3.3)
LYMPH%: 15.4 % (ref 14.0–49.7)
MCH: 25.2 pg (ref 25.1–34.0)
MCHC: 32 g/dL (ref 31.5–36.0)
MCV: 78.6 fL — AB (ref 79.5–101.0)
MONO#: 1.1 10*3/uL — AB (ref 0.1–0.9)
MONO%: 12.5 % (ref 0.0–14.0)
NEUT%: 71.2 % (ref 38.4–76.8)
NEUTROS ABS: 6.5 10*3/uL (ref 1.5–6.5)
PLATELETS: 214 10*3/uL (ref 145–400)
RBC: 4.57 10*6/uL (ref 3.70–5.45)
RDW: 18 % — ABNORMAL HIGH (ref 11.2–14.5)
WBC: 9.2 10*3/uL (ref 3.9–10.3)

## 2015-03-04 LAB — COMPREHENSIVE METABOLIC PANEL (CC13)
ALBUMIN: 4 g/dL (ref 3.5–5.0)
ALK PHOS: 86 U/L (ref 40–150)
ALT: 21 U/L (ref 0–55)
ANION GAP: 10 meq/L (ref 3–11)
AST: 23 U/L (ref 5–34)
BILIRUBIN TOTAL: 0.47 mg/dL (ref 0.20–1.20)
BUN: 12.6 mg/dL (ref 7.0–26.0)
CALCIUM: 9.2 mg/dL (ref 8.4–10.4)
CO2: 27 meq/L (ref 22–29)
CREATININE: 1 mg/dL (ref 0.6–1.1)
Chloride: 101 mEq/L (ref 98–109)
EGFR: 51 mL/min/{1.73_m2} — AB (ref >90)
Glucose: 100 mg/dl (ref 70–140)
Potassium: 3.8 mEq/L (ref 3.5–5.1)
Sodium: 137 mEq/L (ref 136–145)
TOTAL PROTEIN: 6.4 g/dL (ref 6.4–8.3)

## 2015-03-04 NOTE — Progress Notes (Signed)
OFFICE PROGRESS NOTE   March 04, 2015   Physicians:J.Soper/ W.Skeet Latch, J.Kinard, M.Mahoney (PCP, [place] [phone])/ PA Aldean Ast, Truman Hayward (cardiology, [facility]), O'Neal (GI, [facility]), O'Toole (pain clinic [place]), S.MacDiarmid   INTERVAL HISTORY:   Patient is seen, alone for visit (brought to office by son), in scheduled follow up of IIIC carcinosarcoma of uterus, on observation since completing adjuvant radiation and chemotherapy 04-2012.  She saw Dr Skeet Latch in 08-2014 and Dr Sondra Come [phone]. Last CT AP was 06-2012.  Patient has had recent constipation, but good bowel movement today after eating prunes yesterday. Appetite is good, no nausea or vomiting. She is known to gastroenterologist Dr Audie Box, no longer has routine colonoscopies as she is now 69. She is grieving for son who died suddenly 11-04-2014, rest of family all very supportive as they have been. She felt well enough to work in yard for 3 hours this weekend, then fatigued. She denies abdominal or pelvic pain. She denies bleeding, on xarelto for A fib. She has no LE swelling. Bladder frequency unchanged, has not wanted urologic interventions.   No central catheter  ONCOLOGIC HISTORY History is of vaginal spotting beginning Dec 2012, with gyn evaluation delayed because of orthopedic problems then. She saw gynecologist in Feb 2013, with endometrial biopsy revealing endometrial cancer, tho specimen was scant. She was seen in consultation by Dr.Paola Alycia Rossetti 09-27-2011 and had surgery by Dr.Soper at [facility] 10-06-2011, which was robotic hysterectomy/BSO/ bilateral pelvic lymphadenectomy (periaortic nodes not visualized). [facility] [phone]) from 10-06-11 showed carcinosarcoma, heterologous type with rhabdomyosarcomatous differentiation, FIGO grade 3, near transmural invasion in anterior and posterior lower uterine segment (less than 1 mm from serosal surface). There was no extension to cervix, however CIN 3 present in  cervix, 2 of 13 pelvic nodes involved, LVSI present, adnexae not involved, IIIC1. 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 With high risk for recurrence in upper abdomen and for distant recurrence, Dr Clarene Essex recommended taxane + carboplatin given in sandwich fashion with RT; taxotere was chosen in preference to taxol due to preexisting peripheral neuropathy. Cycle 1 Norma Fredrickson /taxotere was given on 11-22-11 and cycle 2 with same drugs on 12-13-11. She was hospitalized in [place] after cycle 1 chemotherapy with symptomatic Afib with RVR. Attempts at cardioversion were not successful; she is being followed by cardiology in [place]. With the Afib, she was initially on xarelto, changed to ASA particularly due to thrombocytopenia with chemotherapy. Performance status and peripheral neuropathy by cycle 3 did not allow taxotere, with carboplatin only used for remainder of chemotherapy. She was treated with the chemotherapy in sandwich fashion with RT, completing 4500 cGY external beam RT in [place] then HDR x3 in [place] thru 03-26-12. She had 3 additional cycles of carboplatin thru 04-30-12, even the single agent carboplatin difficult for her to tolerate. She had restaging CT AP in [facility] system 06-19-12, with no findings of concern for active or progressive malignancy.    Review of systems as above, also: No increased SOB or other respiratory symptoms. No new or different pain.  Remainder of 10 point Review of Systems negative.  Objective:  Vital signs in last 24 hours:  BP 128/70 mmHg  Pulse 74  Temp(Src) 98 F (36.7 C) (Oral)  Resp 18  Ht _0  (1.6 m)  Wt 143 lb 6.4 oz (65.046 kg)  BMI 25.41 kg/m2  SpO2 98%  weight down 4 lbs Alert, oriented and appropriate. Ambulatory without assistance .  HEENT:PERRL, sclerae not icteric. Oral mucosa moist without  lesions, posterior pharynx clear.  Neck supple. No JVD.  Lymphatics:no cervical,supraclavicular, axillary or  inguinal adenopathy Resp: clear to auscultation bilaterally and normal percussion bilaterally Cardio: irregular rate and rhythm. No gallop. GI: soft, nontender, not distended, no mass or organomegaly. Normally active bowel sounds. Surgical incision not remarkable. Musculoskeletal/ Extremities: without pitting edema, cords, tenderness Neuro: speech fluent, moves all extremities equally. Psych: tearful as she tells me of son's death Skin without rash, ecchymosis, petechiae She did not undress for breast exam  Lab Results:  Results for orders placed or performed in visit on 03/04/15  CBC with Differential  Result Value Ref Range   WBC 9.2 3.9 - 10.3 10e3/uL   NEUT# 6.5 1.5 - 6.5 10e3/uL   HGB 11.5 (L) 11.6 - 15.9 g/dL   HCT 35.9 34.8 - 46.6 %   Platelets 214 145 - 400 10e3/uL   MCV 78.6 (L) 79.5 - 101.0 fL   MCH 25.2 25.1 - 34.0 pg   MCHC 32.0 31.5 - 36.0 g/dL   RBC 4.57 3.70 - 5.45 10e6/uL   RDW 18.0 (H) 11.2 - 14.5 %   lymph# 1.4 0.9 - 3.3 10e3/uL   MONO# 1.1 (H) 0.1 - 0.9 10e3/uL   Eosinophils Absolute 0.1 0.0 - 0.5 10e3/uL   Basophils Absolute 0.0 0.0 - 0.1 10e3/uL   NEUT% 71.2 38.4 - 76.8 %   LYMPH% 15.4 14.0 - 49.7 %   MONO% 12.5 0.0 - 14.0 %   EOS% 0.8 0.0 - 7.0 %   BASO% 0.1 0.0 - 2.0 %  Comprehensive metabolic panel (Cmet) - [facility]  Result Value Ref Range   Sodium 137 136 - 145 mEq/L   Potassium 3.8 3.5 - 5.1 mEq/L   Chloride 101 98 - 109 mEq/L   CO2 27 22 - 29 mEq/L   Glucose 100 70 - 140 mg/dl   BUN 12.6 7.0 - 26.0 mg/dL   Creatinine 1.0 0.6 - 1.1 mg/dL   Total Bilirubin 0.47 0.20 - 1.20 mg/dL   Alkaline Phosphatase 86 40 - 150 U/L   AST 23 5 - 34 U/L   ALT 21 0 - 55 U/L   Total Protein 6.4 6.4 - 8.3 g/dL   Albumin 4.0 3.5 - 5.0 g/dL   Calcium 9.2 8.4 - 10.4 mg/dL   Anion Gap 10 3 - 11 mEq/L   EGFR 51 (L) >90 ml/min/1.73 m2     Studies/Results:  No results found.  Medications: I have reviewed the patient's current medications.  DISCUSSION: Patient  asking how long she needs to continue regular visits with gyn onc/ radiation onc/ medical onc. Dr Skeet Latch had suggested continuing to alternate visits thru her next appointment. I am glad to see patient back at any time if I can be of assistance or if she or other MDs request.  Assessment/Plan:   1.Uterine carcinosarcoma: history as above, no clinical concerns that I can tell now. I am glad to see her back on as needed basis. She is to see Dr Sondra Come in Nov and likely back to Dr Skeet Latch from there.Marland Kitchen 2.Atrial fibrillation with RVR, controlled now with medication and on xarelto 3 recent sudden death of son 4. History of degenerative back and neck problems: under care of pain clinic in [place]  5.HTN, elevated lipids  6.vision problems OS around time of onset gyn symptoms  7. left mastectomy WITHOUT axillary node removal for early stage breast cancer 2002. She no longer has mammograms 8 Urinary frequency especially at night x years,  with sleep difficulty related. She did not want interventions by urology 9. Recent constipation, seems resolved with diet changes. Well known to GI if needed.  Patient is comfortable with discussion. Time spent 15 min including >50% counseling and coordination of care. Cc Dr Darron Doom, MD   03/04/2015, 7:58 PM

## 2015-05-13 ENCOUNTER — Ambulatory Visit: Admission: RE | Admit: 2015-05-13 | Payer: Medicare Other | Source: Ambulatory Visit | Admitting: Radiation Oncology

## 2015-05-13 ENCOUNTER — Ambulatory Visit: Payer: Medicare Other | Admitting: Radiation Oncology

## 2015-05-13 ENCOUNTER — Ambulatory Visit: Payer: Self-pay | Admitting: Radiation Oncology

## 2015-05-19 ENCOUNTER — Ambulatory Visit: Payer: Medicare Other | Admitting: Radiation Oncology

## 2015-05-20 ENCOUNTER — Ambulatory Visit: Payer: Medicare Other | Admitting: Radiation Oncology

## 2015-05-20 ENCOUNTER — Encounter: Payer: Self-pay | Admitting: Radiation Oncology

## 2015-05-20 ENCOUNTER — Ambulatory Visit
Admission: RE | Admit: 2015-05-20 | Discharge: 2015-05-20 | Disposition: A | Discharge status: Home / Self Care | Payer: Medicare Other | Source: Ambulatory Visit | Attending: Radiation Oncology | Admitting: Radiation Oncology

## 2015-05-20 VITALS — BP 137/92 | HR 78 | Temp 98.0°F | Resp 18 | Ht 63.0 in | Wt 149.0 lb

## 2015-05-20 DIAGNOSIS — C50812 Malignant neoplasm of overlapping sites of left female breast: Secondary | ICD-10-CM

## 2015-05-20 DIAGNOSIS — C55 Malignant neoplasm of uterus, part unspecified: Secondary | ICD-10-CM

## 2015-05-20 NOTE — Progress Notes (Signed)
Elson Clan here for follow up.  She denies having any pain today.  She reports having to get up 2-4 times per night to urinate and says this has been going on for years.  She reports that she sometimes feels like there is a blockage that keeps her from having a bowel movement.  She reports other times that she is incontinent of stool and does not even know that she has gone.  She reports her last bowel movement was yesterday.  She reports her appetite is good.  She denies having any vaginal/rectal bleeding or discharge. She reports that she does not have any energy.  She is not using a vaginal dilator.  BP 137/92 mmHg  Pulse 78  Temp(Src) 98 F (36.7 C) (Oral)  Resp 18  Ht 5\' 3"  (1.6 m)  Wt 149 lb (67.586 kg)  BMI 26.40 kg/m2  SpO2 99%

## 2015-05-20 NOTE — Progress Notes (Signed)
Radiation Oncology         [phone] ________________________________  Name: Kaylee Pugh MRN: [id number]  Date: 05/20/2015  DOB: Aug 15, 1924  Follow-Up Visit Note  CC: Crist Infante, MD  Diagnosis:  Uterine carcinosarcoma   Primary site: Corpus Uteri - Adenosarcoma   Staging method: AJCC 7th Edition   Clinical free text: IIIC1   Clinical: (T1c, N1, M0)   Pathologic free text: IIIC1   Pathologic: (T1c, N1, M0)   Summary: (T1c, N1, M0)      Left breast cancer    ICD-9-CM ICD-10-CM   1. Uterine carcinosarcoma ([place]) 179 C55   2. Malignant neoplasm of overlapping sites of left female breast ([place]) 174.8 C50.812      Interval Since Last Radiation: 3 years and 2 months  External beam radiation therapy 01/30/2012 through 03/05/2012 [facility]; the patient received intracavitary brachii therapy treatments on September 3 September 10 and March 26, 2012  Site/dose:   Pelvis 4500 cGy in 25 fractions;  the vaginal cuff area was boosted further to a cumulative dose of 6300 cGy with high-dose-rate brachytherapy using iridium 192.  Narrative:  The patient returns today for routine follow-up. She denies having any pain today. She reports having to get up 2-4 times a night to urinate and says this has been going on for years. She reports that she sometimes feels there is a blockage that keeps her from having a bowel movement. She reports other times that she is incontinent of stool. She reports her last bowel movement was yesterday. She reports her appetite is good. She denies any vaginal/rectal bleeding or discharge. She reports low energy and she is not using a vaginal dilator.  ALLERGIES:  is allergic to iodine.  Meds: Current Outpatient Prescriptions  Medication Sig Dispense Refill  . acetaminophen (TYLENOL) 500 MG tablet Take 500 mg by mouth every 6 (six) hours as needed. Pain    . ALPRAZolam (XANAX) 0.25 MG tablet Take 0.5 mg by mouth at bedtime as needed.     .  B Complex-Folic Acid (B COMPLEX-VITAMIN B12) TABS Take 1 tablet by mouth daily. 100 tablet prn  . Calcium Carbonate-Vitamin D (CALCIUM + D) 600-200 MG-UNIT TABS Take 2 tablets by mouth at bedtime.     Marland Kitchen diltiazem (CARDIZEM CD) 180 MG 24 hr capsule Take 120 mg by mouth daily.     . furosemide (LASIX) 20 MG tablet Take 40 mg by mouth daily.     Marland Kitchen gabapentin (NEURONTIN) 300 MG capsule Take 300 mg by mouth 3 (three) times daily.     Marland Kitchen HYDROcodone-acetaminophen (NORCO/VICODIN) 5-325 MG per tablet Take 1 tablet by mouth every 6 (six) hours as needed for moderate pain.    Marland Kitchen levothyroxine (SYNTHROID, LEVOTHROID) 50 MCG tablet Take 50 mcg by mouth daily.    . Multiple Vitamins-Minerals (VISION-VITE PRESERVE PO) Take by mouth.    Marland Kitchen omeprazole (PRILOSEC) 20 MG capsule Take 20 mg by mouth Daily.    . Rivaroxaban (XARELTO) 20 MG TABS tablet Take 15 mg by mouth daily with supper.     . Probiotic Product (PROBIOTIC DAILY PO) Take 1 capsule by mouth. OTC    . traMADol (ULTRAM) 50 MG tablet Take by mouth every 6 (six) hours as needed.     No current facility-administered medications for this encounter.    Physical Findings: The patient is in no acute distress. Patient is alert and oriented.  height is 5\' 3"  (1.6 m) and weight is 149 lb (67.586  kg). Her oral temperature is 98 F (36.7 C). Her blood pressure is 137/92 and her pulse is 78. Her respiration is 18 and oxygen saturation is 99%.  No palpable supraclavicular or axillary adenopathy. The lungs are clear to auscultation. The heart has irregular rhythm consistent with atrial fibrillation. Examination of the right breast reveals no palpable mass or nipple discharge. Examination of the left chest wall reveals a mastectomy scar. no palpable or visible signs recurrence The abdomen is soft and nontender with normal bowel sounds. No inguinal adenopathy is appreciated. The patient has a hernia along the umbilical area which is easily reducible. The patient has had  discomfort in this area and is considering surgery for this issue.  On pelvic examination the external genitalia are unremarkable. A speculum exam is performed. No mucosal lesions are noted in the vaginal vault. On bimanual and rectovaginal examination there no pelvic masses appreciated.  Lab Findings: Lab Results  Component Value Date   WBC 9.2 03/04/2015   HGB 11.5* 03/04/2015   HCT 35.9 03/04/2015   MCV 78.6* 03/04/2015   PLT 214 03/04/2015      Radiographic Findings: No results found.  Impression:  No evidence of recurrence on clinical exam today.  Plan:  Routine followup in 8 months. In the interim the patient will be seen by Dr. Skeet Latch of gynecologic oncology.  ____________________________________ Blair Promise, MD  This document serves as a record of services personally performed by Gery Pray, MD. It was created on his behalf by Darcus Austin, a trained medical scribe. The creation of this record is based on the scribe's personal observations and the provider's statements to them. This document has been checked and approved by the attending provider.

## 2015-08-11 HISTORY — PX: PACEMAKER PLACEMENT: SHX43

## 2015-10-07 ENCOUNTER — Ambulatory Visit: Payer: Medicare Other | Attending: Gynecologic Oncology | Admitting: Gynecologic Oncology

## 2015-10-07 ENCOUNTER — Encounter: Payer: Self-pay | Admitting: Gynecologic Oncology

## 2015-10-07 VITALS — BP 129/70 | HR 61 | Resp 18

## 2015-10-07 DIAGNOSIS — Z853 Personal history of malignant neoplasm of breast: Secondary | ICD-10-CM | POA: Diagnosis not present

## 2015-10-07 DIAGNOSIS — D069 Carcinoma in situ of cervix, unspecified: Secondary | ICD-10-CM | POA: Insufficient documentation

## 2015-10-07 DIAGNOSIS — R0602 Shortness of breath: Secondary | ICD-10-CM | POA: Diagnosis not present

## 2015-10-07 DIAGNOSIS — R001 Bradycardia, unspecified: Secondary | ICD-10-CM | POA: Insufficient documentation

## 2015-10-07 DIAGNOSIS — E079 Disorder of thyroid, unspecified: Secondary | ICD-10-CM | POA: Diagnosis not present

## 2015-10-07 DIAGNOSIS — C541 Malignant neoplasm of endometrium: Secondary | ICD-10-CM | POA: Diagnosis present

## 2015-10-07 DIAGNOSIS — Z923 Personal history of irradiation: Secondary | ICD-10-CM | POA: Insufficient documentation

## 2015-10-07 DIAGNOSIS — E785 Hyperlipidemia, unspecified: Secondary | ICD-10-CM | POA: Insufficient documentation

## 2015-10-07 DIAGNOSIS — Z950 Presence of cardiac pacemaker: Secondary | ICD-10-CM | POA: Diagnosis not present

## 2015-10-07 DIAGNOSIS — Z8542 Personal history of malignant neoplasm of other parts of uterus: Secondary | ICD-10-CM | POA: Diagnosis not present

## 2015-10-07 DIAGNOSIS — I10 Essential (primary) hypertension: Secondary | ICD-10-CM | POA: Diagnosis not present

## 2015-10-07 DIAGNOSIS — R32 Unspecified urinary incontinence: Secondary | ICD-10-CM | POA: Diagnosis not present

## 2015-10-07 DIAGNOSIS — I4891 Unspecified atrial fibrillation: Secondary | ICD-10-CM | POA: Diagnosis not present

## 2015-10-07 NOTE — Progress Notes (Signed)
Office Visit  Note: Gyn-Onc  Kaylee Pugh 82 y.o. female  CC:  Surveillance uterine carcinosarcoma  Assessment/Plan: 82 y.o. with stage IIIC1 uterine carcinosarcoma. Minimally invasive endometrial cancer staging was performed by Dr. Cindie Pugh in March of 2013. She since receive 3 cycles of Taxotere and carboplatin therapy external beam radiation therapy and brachytherapy completed March 26, 2012.  She then received 3 cycles of single agent carboplatin.  Kaylee Pugh without any evidence of disease.  Followup with Dr. Sondra Pugh in 01/2016 Followup with GYN oncology in 07/2016  SOB History of A. fib with intermittent bradycardia. A Pacemaker was placed last week. The patient was Advised to follow-up with her cardiologist regarding continued shortness of breath  HPI: Patient is an 82 y.o. gravida 4 para 4 who began having some bleeding December 23 of the year 2012.  An endometrial biopsy 08/2011 office revealed endometrial cancer.The specimen was scant and fragmented and they were not able to give her final classification or grading.   10/06/2011 underwent [place] BSO BPLND at [facility] by Dr. Clarene Pugh at [facility].  Path Diagnosis:  A:Histologic type: Carcinosarcoma (malignant mixed M llerian tumor), heterologous type with rhabdomyosarcomatous differentiation (see comment) Histologic grade: FIGO grade 3 Tumor site: Primary endometrial cancer Myometrial invasion: Near transmural invasion in the anterior and posterior lower uterine segment, 1.5 cm in depth, less than 1 mm from the serosa Serosal involvement: Extremely close, less than 1 mm to serosal surface Cervical involvement: - Not involved by the endometrial carcinosarcoma. - High grade squamous intraepithelial lesion, cervical intraepithelial neoplasia grade 3 (CIN 3) is present in multiple sections from the cervix with extension into superficial endocervical glands, margins are uninvolved Other involved sites: left pelvic lymph nodes Lymphovascular  space invasion: Present Regional lymph nodes (see other specimens B and C): Total number involved: 2  Total number examined: 13 FIGO (2009 classification) Stage Grouping: IIIC1  The patient has received three cycles of Taxoterel/Carboplatin.  CT scan Abd/Pelvis 12/2011 without evidence of residual disease. She subsequently received external beam pelvic radiotherapy and vaginal brachytherapy that was completed in September of 2013. She then received 3 additional cycles of carboplatin.  . Of note carboplatin was poorly tolerated. A CT of the abdomen and pelvis performed in December of 2030 without any evidence of progressive disease.  Kaylee Pugh intermittent BLE edema that is stable. - has declined referral to lymphedema clinic. Reports difficuly ambulating over the past 2 days because of the callus on the sole of her R foot.  Also c/o redness of her RLE.  Had a surgical procedure on her right foot 4 weeks ago.  Has a visit with her podiatrist scheduled for tomorrow.  Pap May 2016 within normal limits  Report significant shortness of breath with a sense of doom/melting. Underwent in-house evaluation inclusive of a Holter monitor. This resulted in the placement of a pacemaker last week. [name] reports continued episodes of shortness of breath.  Social Hx:    Son is engaged in her life.  Does her ADL.  Enjoyed ziplining over Mother's day weekend.  Past Surgical Hx:  Consistent of a bladder tacking for urinary incontinence. She is a gravida 4 para 4. At mastectomy for an early stage breast cancer in 2002 and did not require any additional chemotherapy radiation or adjuvant hormonal management.  Past Surgical History  Procedure Laterality Date  . Cataract extraction  1987    Left eye  . Cataract extraction  2002    Right eye  . Retinal  detachment surgery  2000  . Bladder repair  2001  . Tubal ligation  1966  . Appendectomy  1966  . Mastectomy  2002    left  . Abdominal hysterectomy     . Tee without cardioversion  12/01/2011    Procedure: TRANSESOPHAGEAL ECHOCARDIOGRAM (TEE);  Surgeon: Kaylee Riddle, MD;  Location: [place];  Service: Cardiovascular;  Laterality: N/A;  . Cardioversion  12/01/2011    Procedure: CARDIOVERSION;  Surgeon: Kaylee Riddle, MD;  Location: [facility] ENDOSCOPY;  Service: Cardiovascular;  Laterality: N/A;    Past Medical Hx:  Past Medical History  Diagnosis Date  . Breast cancer ([place])   . Hypertension   . Hyperlipidemia   . Thyroid disease   . Osteoporosis   . Post-menopausal bleeding   . 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 Low sodium     hx of x 2 per pt and son  . Radiation 01/30/12-03/05/12    6300 cGy external beam radiation, pelvis, vaginal cuff  . Radiation     Brachytherapy 1800 cGy intracavitary  . History of chemotherapy     Taxotere/carboplatin  . Uterine cancer ([place])   . Atrial fibrillation ([place])     REVIEW OF SYSTEMS Constitutional  Feels well,  Cardiovascular  No chest pain,intermittent significantUse of breath with exertion. Intermittent bilateral lower extremity edema  Pulmonary  No cough or wheeze no hemoptysis Gastro Intestinal  No nausea, vomitting, or diarrhoea. Denies rectal bleeding  Genito Urinary  No frequency, urgency, dysuria.  No vaginal bleeding, no change in bladder habits. Musculo Skeletal  Swelling and erythema of the RLE for two days.  Difficulty ambulating because of the callus on her right sole. Neurologic  Weakness associated with shortness of breath,   No headache, no visual changes, no change in mentation Psychology  No depression, anxiety, insomnia.    Vitals: 61 129/70 Physical Exam: Nourished well developed elderly female in no acute distress. Patient appears younger than stated age.  Neck: No lymphadenopathy no thyromegaly.  Lungs: Clear to auscultation bilaterally.  Cardiovascular: Irregular regular rate and rhythm.  Abdomen: Well-healed  infraumbilical vertical incision. There is no evidence of an incisional hernias at the trocar sites. She has palpable 2 cm umbilical hernia that is stable non tender and reducible. There are no masses or nodularity.  Lymph Nodes No cervical supraclavicular or inguinal lymphadenopathy.  Back:  No CVAT  Pelvic: External genitalia within normal limits,  The vagina is markedly atrophic and foreshotened. Left paravaginal sidewall prolapse.   No nodularity in the cul de sac    Rectal: Good tone no masses  Extremities: No Cyanosis or edema

## 2015-10-07 NOTE — Patient Instructions (Signed)
Followup with Dr. Sondra Come in 01/2016 Followup with GYN oncology in 07/2016     Thank you very much Ms. Kaylee Pugh for allowing me to provide care for you today.  I appreciate your confidence in choosing our Gynecologic Oncology team.  If you have any questions about your visit today please call our office and we will get back to you as soon as possible.  Please consider using the website Medlineplus.gov as an Geneticist, molecular.   Francetta Found. Virginia Francisco MD., PhD Gynecologic Oncology

## 2015-12-09 HISTORY — PX: FOOT SURGERY: SHX648

## 2016-01-20 ENCOUNTER — Encounter: Payer: Self-pay | Admitting: Radiation Oncology

## 2016-01-20 ENCOUNTER — Ambulatory Visit
Admission: RE | Admit: 2016-01-20 | Discharge: 2016-01-20 | Disposition: A | Discharge status: Home / Self Care | Payer: Medicare Other | Source: Ambulatory Visit | Attending: Radiation Oncology | Admitting: Radiation Oncology

## 2016-01-20 ENCOUNTER — Other Ambulatory Visit (HOSPITAL_COMMUNITY)
Admission: RE | Admit: 2016-01-20 | Discharge: 2016-01-20 | Disposition: A | Discharge status: Home / Self Care | Payer: Medicare Other | Source: Ambulatory Visit | Attending: Radiation Oncology | Admitting: Radiation Oncology

## 2016-01-20 VITALS — BP 118/75 | HR 72 | Temp 97.8°F | Ht 63.0 in | Wt 141.6 lb

## 2016-01-20 DIAGNOSIS — Z124 Encounter for screening for malignant neoplasm of cervix: Secondary | ICD-10-CM | POA: Insufficient documentation

## 2016-01-20 DIAGNOSIS — C55 Malignant neoplasm of uterus, part unspecified: Secondary | ICD-10-CM | POA: Diagnosis not present

## 2016-01-20 NOTE — Progress Notes (Signed)
Ms. Hyler presents for follow up of radiation completed 03/05/2012 (external beam) and Brachi therapy 03/12/12- 03/19/12. She denies pain. She denies bowel or bladder concerns. She denies any type of vaginal discharge or bleeding. She has a vaginal dilator that she was given but reports she does not use it. She is eating well. She has no other concerns at this time.   BP 118/75 mmHg  Pulse 72  Temp(Src) 97.8 F (36.6 C)  Ht 5\' 3"  (1.6 m)  Wt 141 lb 9.6 oz (64.229 kg)  BMI 25.09 kg/m2  SpO2 97%   Wt Readings from Last 3 Encounters:  01/20/16 141 lb 9.6 oz (64.229 kg)  05/20/15 149 lb (67.586 kg)  03/04/15 143 lb 6.4 oz (65.046 kg)

## 2016-01-20 NOTE — Progress Notes (Signed)
Radiation Oncology         [phone] ________________________________  Name: Kaylee Pugh MRN: [id number]  Date: 01/20/2016  DOB: 05-Sep-1924  Follow-Up Visit Note  CC: Crist Infante, MD  Diagnosis:  Uterine carcinosarcoma   Primary site: Corpus Uteri - Adenosarcoma   Staging method: AJCC 7th Edition   Clinical free text: IIIC1   Clinical: (T1c, N1, M0)   Pathologic free text: IIIC1   Pathologic: (T1c, N1, M0)   Summary: (T1c, N1, M0)          ICD-9-CM ICD-10-CM   1. Uterine carcinosarcoma ([facility]) 179 C55     Interval Since Last Radiation: 3 years and 10 months  03/12/12, 03/19/12, 03/26/12: Intracavitary brachii therapy treatments. The vaginal cuff area was boosted further to a cumulative dose of 6300 cGy with high-dose-rate brachytherapy using iridium 192.  01/30/2012 through 03/05/2012 at [place], [place]: External beam radiation therapy. Pelvis 4500 cGy in 25 fractions  Narrative:  The patient returns today for routine follow-up. She denies pain, bowel or bladder concerns, or any type of vaginal discharge or bleeding. She has a vaginal dilator that she was given, but reports she does not use it. She is eating well. She has no other concerns at this time. Her daughter-in-law was present during the encounter. The patient's late Pap smear was on 11/26/14.  ALLERGIES:  is allergic to iodine.  Meds: Current Outpatient Prescriptions  Medication Sig Dispense Refill  . acetaminophen (TYLENOL) 500 MG tablet Take 500 mg by mouth every 6 (six) hours as needed. Pain    . ALPRAZolam (XANAX) 0.25 MG tablet Take 0.5 mg by mouth at bedtime as needed.     . B Complex-Folic Acid (B COMPLEX-VITAMIN B12) TABS Take 1 tablet by mouth daily. 100 tablet prn  . Calcium Carbonate-Vitamin D (CALCIUM + D) 600-200 MG-UNIT TABS Take 2 tablets by mouth at bedtime.     Marland Kitchen diltiazem (CARDIZEM CD) 180 MG 24 hr capsule Take 120 mg by mouth daily.     . furosemide (LASIX) 20 MG tablet Take 40  mg by mouth daily.     Marland Kitchen gabapentin (NEURONTIN) 300 MG capsule Take 300 mg by mouth 3 (three) times daily.     Marland Kitchen levothyroxine (SYNTHROID, LEVOTHROID) 50 MCG tablet Take 50 mcg by mouth daily.    . Multiple Vitamins-Minerals (VISION-VITE PRESERVE PO) Take by mouth.    Marland Kitchen omeprazole (PRILOSEC) 20 MG capsule Take 20 mg by mouth Daily.    . Rivaroxaban (XARELTO) 20 MG TABS tablet Take 15 mg by mouth daily with supper.      No current facility-administered medications for this encounter.    Physical Findings: The patient is in no acute distress. Patient is alert and oriented.  height is 5\' 3"  (1.6 m) and weight is 141 lb 9.6 oz (64.229 kg). Her temperature is 97.8 F (36.6 C). Her blood pressure is 118/75 and her pulse is 72. Her oxygen saturation is 97%.  No palpable supraclavicular or axillary adenopathy. The lungs are clear to auscultation. The heart has irregular rhythm consistent with atrial fibrillation. Regular rhythm and rate (pacemaker in place in the left upper chest).  The patient has gauze wrapped around the right foot for an infection, the gauze was not removed for examination.    On pelvic examination the external genitalia were unremarkable. A speculum exam was performed. There are no mucosal lesions noted in the vaginal vault. A Pap smear was obtained of the proximal vagina. On bimanual  and rectovaginal examination there were no pelvic masses appreciated.  Lab Findings: Lab Results  Component Value Date   WBC 9.2 03/04/2015   HGB 11.5* 03/04/2015   HCT 35.9 03/04/2015   MCV 78.6* 03/04/2015   PLT 214 03/04/2015      Radiographic Findings: No results found.  Impression:  No evidence of recurrence on clinical exam today. Pap smear pending.  Plan:  The patient will be seen by Dr. Skeet Latch, of gyn/onc, in 6 months. Routine followup in 1 year.  ____________________________________ -----------------------------------  Blair Promise, PhD, MD   This document serves  as a record of services personally performed by Gery Pray, MD. It was created on his behalf by Darcus Austin, a trained medical scribe. The creation of this record is based on the scribe's personal observations and the provider's statements to them. This document has been checked and approved by the attending provider.

## 2016-01-20 NOTE — Addendum Note (Signed)
Encounter addended by: Jacqulyn Liner, RN on: 01/20/2016  4:23 PM<BR>     Documentation filed: Charges VN

## 2016-01-25 LAB — CYTOLOGY - PAP

## 2016-01-26 ENCOUNTER — Telehealth: Payer: Self-pay | Admitting: Oncology

## 2016-01-26 NOTE — Telephone Encounter (Addendum)
Left message with Magaline's son, Lennette Bihari, that her Pap test results were good.  He verbalized understanding and agreement and will tell Leilia today.

## 2016-04-25 DIAGNOSIS — D069 Carcinoma in situ of cervix, unspecified: Secondary | ICD-10-CM | POA: Insufficient documentation

## 2016-04-25 NOTE — Progress Notes (Signed)
Office Visit  Note: Gyn-Onc  Kaylee Pugh 82 y.o. female  CC:  Surveillance uterine carcinosarcoma  Assessment/Plan: 82 y.o. with stage IIIC1 uterine carcinosarcoma. Minimally invasive endometrial cancer staging was performed by Dr. Cindie Laroche in March of 2013. She since receive 3 cycles of Taxotere and carboplatin therapy external beam radiation therapy and brachytherapy completed March 26, 2012.  She then received 3 cycles of single agent carboplatin.  Kaylee Pugh without any evidence of disease.  Followup with GYN oncology in September /2018 Follow-up with Dr. Sondra Come  March 2018  SOB History of A. fib with intermittent bradycardia, shortness of breath has not been improved with placement of a pacemaker. The cardiologist is entertaining other etiologies for the continued shortness of breath  Hernia This hernia increased in size since her last visit. On examination it is nontender and completely and easily reducible . She was counseled on the signs and symptoms of incarcerated hernia. She is encouraged to continue use of the abdominal binder  HPI: Patient is an 82 y.o. gravida 4 para 4 who began having some bleeding December 23 of the year 2012.  An endometrial biopsy 08/2011 office revealed endometrial cancer.The specimen was scant and fragmented and they were not able to give her final classification or grading.   10/06/2011 underwent [place] BSO BPLND at [facility] by Dr. Clarene Essex at [facility].  Path Diagnosis:  A:Histologic type: Carcinosarcoma (malignant mixed M llerian tumor), heterologous type with rhabdomyosarcomatous differentiation (see comment) Histologic grade: FIGO grade 3- High grade squamous intraepithelial lesion, cervical intraepithelial neoplasia grade 3 (CIN 3) is present in multiple sections from the cervix with extension into superficial endocervical glands, margins are uninvolved Other involved sites: left pelvic lymph nodes Lymphovascular space invasion: Present Regional lymph  nodes (see other specimens B and C): Total number involved: 2  Total number examined: 13 FIGO (2009 classification) Stage Grouping: IIIC1  The patient has received three cycles of Taxoterel/Carboplatin.  CT scan Abd/Pelvis 12/2011 without evidence of residual disease. She subsequently received external beam pelvic radiotherapy and vaginal brachytherapy that was completed in September of 2013. She then received 3 additional cycles of carboplatin.  Kaylee Pugh intermittent BLE edema that is stable. - has declined referral to lymphedema clinic. Reports difficuly ambulating over the past 2 days because of the callus on the sole of her R foot.  Also c/o redness of her RLE.  Had a surgical procedure on her right foot 4 weeks ago.  Has a visit with her podiatrist scheduled for tomorrow.  Pap 01/2016 within normal limits  Social Hx:   Son is engaged in her life.  Does her ADL. Has been less active because of the persistent shortness of breath.   Past Surgical Hx:  Consistent of a bladder tacking for urinary incontinence. She is a gravida 4 para 4. At mastectomy for an early stage breast cancer in 2002 and did not require any additional chemotherapy radiation or adjuvant hormonal management.  Past Surgical History:  Procedure Laterality Date  . ABDOMINAL HYSTERECTOMY    . APPENDECTOMY  1966  . BLADDER REPAIR  2001  . CARDIOVERSION  12/01/2011   Procedure: CARDIOVERSION;  Surgeon: Birdie Riddle, MD;  Location: [place];  Service: Cardiovascular;  Laterality: N/A;  . CATARACT EXTRACTION  1987   Left eye  . CATARACT EXTRACTION  2002   Right eye  . MASTECTOMY  2002   left  . RETINAL DETACHMENT SURGERY  2000  . TEE WITHOUT CARDIOVERSION  12/01/2011   Procedure: TRANSESOPHAGEAL ECHOCARDIOGRAM (TEE);  Surgeon: Birdie Riddle, MD;  Location: [place];  Service: Cardiovascular;  Laterality: N/A;  . TUBAL LIGATION  1966    Past Medical Hx:  Past Medical History:  Diagnosis Date  .  Atrial fibrillation ([place])   . Breast cancer ([place])   . Eye abnormality 11/29/2011   recent ruputured blood vessel in left eye  . History of chemotherapy    Taxotere/carboplatin  . Hyperlipidemia   . Hypertension   . Low sodium    hx of x 2 per pt and son  . Osteoporosis   . Post-menopausal bleeding   . Radiation 01/30/12-03/05/12   6300 cGy external beam radiation, pelvis, vaginal cuff  . Radiation    Brachytherapy 1800 cGy intracavitary  . Thyroid disease   . Uterine cancer ([place])     REVIEW OF SYSTEMS Constitutional  Feels well,  Cardiovascular  No chest pain,intermittent SOB with limited exertion. Intermittent bilateral lower extremity edema  Pulmonary  No cough or wheeze no hemoptysis Gastro Intestinal  No nausea, vomitting, or diarrhoea. Denies rectal bleeding  Genito Urinary  No frequency, urgency, dysuria.  No vaginal bleeding, no change in bladder habits. Musculo Skeletal  Swelling and erythema of the RLE for two days.  Difficulty ambulating because of the callus on her right foot  Neurologic  Weakness associated with shortness of breath,   No headache, no visual changes, no change in mentation Psychology  No depression, anxiety, insomnia.    Vitals: BP 126/72 (BP Location: Left Arm, Patient Position: Sitting)   Pulse 80   Temp 97.9 F (36.6 C) (Oral)   Resp 18   Wt 147 lb 11.2 oz (67 kg)   SpO2 95%   BMI 26.16 kg/m  Physical Exam: Nourished well developed elderly female in no acute distress. Patient appears younger than stated age.  Neck: No lymphadenopathy no thyromegaly.  Lungs: Clear to auscultation bilaterally.  Cardiovascular: Irregular regular rate and rhythm.  Abdomen: Well-healed infraumbilical vertical incision. There is no evidence of an incisional hernias at the trocar sites. She has palpable 6 cm umbilical hernia that is stable non tender and reducible. There are no masses or nodularity.  Lymph Nodes No cervical supraclavicular or inguinal  lymphadenopathy.  Back:  No CVAT  Pelvic: External genitalia within normal limits,  The vagina is markedly atrophic and foreshotened. Left paravaginal sidewall prolapse.   No nodularity in the cul de sac    Rectal: Good tone no masses  Extremities: No Cyanosis or edema

## 2016-04-27 ENCOUNTER — Encounter: Payer: Self-pay | Admitting: Gynecologic Oncology

## 2016-04-27 ENCOUNTER — Ambulatory Visit: Payer: Medicare Other | Attending: Gynecologic Oncology | Admitting: Gynecologic Oncology

## 2016-04-27 VITALS — BP 126/72 | HR 80 | Temp 97.9°F | Resp 18 | Wt 147.7 lb

## 2016-04-27 DIAGNOSIS — Z8542 Personal history of malignant neoplasm of other parts of uterus: Secondary | ICD-10-CM | POA: Diagnosis present

## 2016-04-27 DIAGNOSIS — D069 Carcinoma in situ of cervix, unspecified: Secondary | ICD-10-CM

## 2016-04-27 DIAGNOSIS — C55 Malignant neoplasm of uterus, part unspecified: Secondary | ICD-10-CM

## 2016-04-27 DIAGNOSIS — R001 Bradycardia, unspecified: Secondary | ICD-10-CM | POA: Insufficient documentation

## 2016-04-27 DIAGNOSIS — Z9889 Other specified postprocedural states: Secondary | ICD-10-CM | POA: Diagnosis not present

## 2016-04-27 DIAGNOSIS — R0602 Shortness of breath: Secondary | ICD-10-CM

## 2016-04-27 DIAGNOSIS — I4891 Unspecified atrial fibrillation: Secondary | ICD-10-CM

## 2016-04-27 DIAGNOSIS — K469 Unspecified abdominal hernia without obstruction or gangrene: Secondary | ICD-10-CM | POA: Diagnosis not present

## 2016-04-27 NOTE — Patient Instructions (Addendum)
Plan to follow up with Dr. Sondra Come in September 14, 2016 at 11:30am and Dr. Skeet Latch in September 2018.  Please call our office at [phone] after you see Dr. Sondra Come to schedule your appointment with Dr. Skeet Latch in September.  Please call for any needs or new symptoms.   Hernia A hernia happens when an organ or tissue inside your body pushes out through a weak spot in the belly (abdomen). HOME CARE  Avoid stretching or overusing (straining) the muscles near the hernia.  Do not lift anything heavier than 10 lb (4.5 kg).  Use the muscles in your leg when you lift something up. Do not use the muscles in your back.  When you cough, try to cough gently.  Eat a diet that has a lot of fiber. Eat lots of fruits and vegetables.  Drink enough fluids to keep your pee (urine) clear or pale yellow. Try to drink 6-8 glasses of water a day.  Take medicines to make your poop soft (stool softeners) as told by your doctor.  Lose weight, if you are overweight.  Do not use any tobacco products, including cigarettes, chewing tobacco, or electronic cigarettes. If you need help quitting, ask your doctor.  Keep all follow-up visits as told by your doctor. This is important. GET HELP IF:  The skin by the hernia gets puffy (swollen) or red.  The hernia is painful. GET HELP RIGHT AWAY IF:  You have a fever.  You have belly pain that is getting worse.  You feel sick to your stomach (nauseous) or you throw up (vomit).  You cannot push the hernia back in place by gently pressing on it while you are lying down.  The hernia:  Changes in shape or size.  Is stuck outside your belly.  Changes color.  Feels hard or tender.   This information is not intended to replace advice given to you by your health care provider. Make sure you discuss any questions you have with your health care provider.   Document Released: 12/14/2009 Document Revised: 07/17/2014 Document Reviewed: 05/06/2014 Elsevier  Interactive Patient Education [employer].

## 2016-06-16 ENCOUNTER — Telehealth: Payer: Self-pay

## 2016-06-16 NOTE — Telephone Encounter (Signed)
Kaylee Pugh had called to see if mother could be transferred to [facility]  She has admitted to a hospital in [place]. ~ 3 days ago. The Oncologist there thought that Kaylee Pugh may have vulvar CA. Mother is very uncomfortable and would like to have a plan in place.  LM that Dr. Marko Plume said that it would be benificial for Kaylee Pugh if the son contacted GYN Oncologists Dr. Clarene Essex and or Dr. Skeet Latch at [facility].

## 2016-06-23 ENCOUNTER — Telehealth: Payer: Self-pay | Admitting: *Deleted

## 2016-06-23 NOTE — Telephone Encounter (Signed)
Son calling wanting clarification from MD [place] regarding the transfer of care to [facility]. Patient son would prefer a call back.

## 2016-06-23 NOTE — Telephone Encounter (Signed)
Error in charting.

## 2016-07-07 ENCOUNTER — Encounter: Payer: Self-pay | Admitting: Gynecology

## 2016-07-07 ENCOUNTER — Other Ambulatory Visit: Payer: Self-pay | Admitting: Nurse Practitioner

## 2016-07-07 ENCOUNTER — Telehealth: Payer: Self-pay | Admitting: Gynecologic Oncology

## 2016-07-07 ENCOUNTER — Ambulatory Visit: Payer: Medicare Other | Attending: Gynecology | Admitting: Gynecology

## 2016-07-07 VITALS — BP 146/62 | HR 60 | Temp 97.5°F | Resp 18 | Ht 63.0 in | Wt 151.2 lb

## 2016-07-07 DIAGNOSIS — E079 Disorder of thyroid, unspecified: Secondary | ICD-10-CM | POA: Insufficient documentation

## 2016-07-07 DIAGNOSIS — C55 Malignant neoplasm of uterus, part unspecified: Secondary | ICD-10-CM | POA: Insufficient documentation

## 2016-07-07 DIAGNOSIS — N762 Acute vulvitis: Secondary | ICD-10-CM

## 2016-07-07 DIAGNOSIS — Z950 Presence of cardiac pacemaker: Secondary | ICD-10-CM | POA: Insufficient documentation

## 2016-07-07 DIAGNOSIS — Z9012 Acquired absence of left breast and nipple: Secondary | ICD-10-CM | POA: Diagnosis not present

## 2016-07-07 DIAGNOSIS — I4891 Unspecified atrial fibrillation: Secondary | ICD-10-CM | POA: Diagnosis not present

## 2016-07-07 DIAGNOSIS — Z853 Personal history of malignant neoplasm of breast: Secondary | ICD-10-CM | POA: Diagnosis not present

## 2016-07-07 DIAGNOSIS — E785 Hyperlipidemia, unspecified: Secondary | ICD-10-CM | POA: Diagnosis not present

## 2016-07-07 DIAGNOSIS — M810 Age-related osteoporosis without current pathological fracture: Secondary | ICD-10-CM | POA: Diagnosis not present

## 2016-07-07 DIAGNOSIS — R197 Diarrhea, unspecified: Secondary | ICD-10-CM | POA: Insufficient documentation

## 2016-07-07 DIAGNOSIS — I10 Essential (primary) hypertension: Secondary | ICD-10-CM | POA: Insufficient documentation

## 2016-07-07 NOTE — Progress Notes (Signed)
Consult Note: Gyn-Onc   Kaylee Pugh 82 y.o. female  Chief Complaint  Patient presents with  . uterine carsinocarcoma    Assessment :Resolving vulvar cellulitis. Diarrhea associated with recent antibiotic use.  Plan: The patient will continue her current regimen of managing her vulva which is nearly completely resolved. She indicates she's been tested for C. difficile and Escherichia coli which were both negative. Therefore I would recommend that she begin taking Imodium, probiotics, and use Tucks for wiping along with her current steroid cream to relieve anal pain.  She will return to see Dr. Sondra Come in March 2018 and Dr. Skeet Latch in September 2018.  Interval History: Patient presents today for evaluation of a new vulvar problem. She was recently admitted to the hospital and [facility] with what is described by her son as a vulvar cellulitis. Apparently the vulva was incredibly inflamed and very firm. She was treated with intravenous antibiotics and has recently finished a course of oral antibiotics. They indicate that the vulvar symptoms have resolved nearly completely and that the firm hard area is now also completely resolved. She denies that she had any fever or chills. Subsequent to discontinuing antibiotics she developed diarrhea. She has been tested for C. difficile and Escherichia coli by her primary care physician and they report that this was negative. The patient is not diabetic and does not recall any inciting event that may have created the cellulitis.  [id number] is an 82 y.o. gravida 4 para 4 who began having some bleeding July 02, 2011.  An endometrial biopsy 08/2011  revealed endometrial cancer.   10/06/2011 underwent [place] Section Three BSO BPLND at [facility] by Dr. Clarene Essex at [facility].  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  CT scan Abd/Pelvis 12/2011 without evidence of residual disease. She subsequently received external beam pelvic radiotherapy and vaginal brachytherapy that was completed in September of 2013. She then received 3 additional cycles of carboplatin.  .   Review of Systems:10 point review of systems is negative except as noted in interval history.   Vitals: Blood pressure (!) 146/62, pulse 60, temperature 97.5 F (36.4 C), resp. rate 18, height 5\' 3"  (1.6 m), weight 151 lb 3.2 oz (68.6 kg), SpO2 100 %.  Physical Exam: General : The patient is a healthy woman in no acute distress.  HEENT: normocephalic, extraoccular movements normal; neck is supple without thyromegally  Lynphnodes: Supraclavicular and inguinal nodes not enlarged  Abdomen: Soft, non-tender, no ascites, no organomegally, no masses, no hernias  Pelvic:  EGBUS: Normal female , there is a very small area along the lateral aspect of the left labia majora which is slightly firm. This is nontender. There are no masses abscesses or cysts in the vulva. Vagina: The vagina is foreshortened. Inspection shows no lesions. Palpation of the vagina reveals no lesions masses or tenderness. Urethra and Bladder: Normal, non-tender  Cervix: Surgically absent  Uterus: Surgically absent  Bi-manual examination: Non-tender; no adenxal masses or nodularity  Rectal: normal sphincter tone,  no masses, no blood  Lower extremities: No edema or varicosities. Normal  range of motion      Allergies  Allergen Reactions  . Iodine Swelling and Rash    Iodine contrast    Past Medical History:  Diagnosis Date  . Atrial fibrillation ([place])   . Breast cancer ([place])   . Eye abnormality 11/29/2011   recent ruputured blood vessel in left eye  . History of chemotherapy    Taxotere/carboplatin  . Hyperlipidemia   . Hypertension   . Low sodium    hx of x 2 per pt and son  . Osteoporosis   . Post-menopausal bleeding   . Radiation 01/30/12-03/05/12   6300 cGy external beam radiation, pelvis, vaginal cuff  . Radiation    Brachytherapy 1800 cGy intracavitary  . Thyroid disease   . Uterine cancer [facility])     Past Surgical History:  Procedure Laterality Date  . ABDOMINAL HYSTERECTOMY    . APPENDECTOMY  1966  . BLADDER REPAIR  2001  . CARDIOVERSION  12/01/2011   Procedure: CARDIOVERSION;  Surgeon: Birdie Riddle, MD;  Location: [place];  Service: Cardiovascular;  Laterality: N/A;  . CATARACT EXTRACTION  1987   Left eye  . CATARACT EXTRACTION  2002   Right eye  . FOOT SURGERY Right 12/2015   infected foot  . MASTECTOMY  2002   left  . PACEMAKER PLACEMENT  08/2015  . RETINAL DETACHMENT SURGERY  2000  . TEE WITHOUT CARDIOVERSION  12/01/2011   Procedure: TRANSESOPHAGEAL ECHOCARDIOGRAM (TEE);  Surgeon: Birdie Riddle, MD;  Location: [place];  Service: Cardiovascular;  Laterality: N/A;  . TUBAL LIGATION  1966    Current Outpatient Prescriptions  Medication Sig Dispense Refill  . acetaminophen (TYLENOL) 500 MG tablet Take 500 mg by mouth every 6 (six) hours as needed. Pain    . ALPRAZolam (XANAX) 0.25 MG tablet Take 0.5 mg by mouth at bedtime as needed.     . B Complex-Folic Acid (B COMPLEX-VITAMIN B12) TABS Take 1 tablet by mouth daily. 100 tablet prn  . Calcium Carbonate-Vitamin D (CALCIUM + D) 600-200 MG-UNIT TABS Take 2 tablets by mouth at bedtime.     Marland Kitchen CARTIA XT 120 MG 24 hr capsule     . diclofenac sodium (VOLTAREN) 1 % GEL     .  furosemide (LASIX) 20 MG tablet Take 40 mg by mouth daily.     Marland Kitchen gabapentin (NEURONTIN) 300 MG capsule Take 300 mg by mouth 3 (three) times daily.     Marland Kitchen levothyroxine (SYNTHROID, LEVOTHROID) 50 MCG tablet Take 50 mcg by mouth daily.    . Multiple Vitamins-Minerals (VISION-VITE PRESERVE PO) Take by mouth.    Marland Kitchen omeprazole (PRILOSEC) 20 MG capsule Take 20 mg by mouth Daily.    Marland Kitchen PROCTO-MED HC 2.5 % rectal cream     . Rivaroxaban (XARELTO) 20 MG TABS tablet Take 15 mg by mouth daily with supper.     . tolvaptan (SAMSCA) 15 MG TABS tablet Take 15 mg by mouth daily.    Marland Kitchen LUMIGAN 0.01 % SOLN      No current facility-administered medications for this visit.     Social History   Social History  . Marital status: Widowed    Spouse name: N/A  . Number of children: N/A  . Years of education: N/A   Occupational History  . Not on file.   Social History Main Topics  . Smoking status: Never Smoker  . Smokeless tobacco: Never  Used  . Alcohol use Not on file  . Drug use: Unknown  . Sexual activity: No   Other Topics Concern  . Not on file   Social History Narrative  . No narrative on file    Family History  Problem Relation Age of Onset  . Heart disease Father   . Diabetes Mother   . Kidney disease Sister       Marti Sleigh, MD 07/07/2016, 9:32 AM

## 2016-07-07 NOTE — Patient Instructions (Signed)
Plan to begin taking Imodium over the counter for your diarrhea.  You can also take probiotics and eat Activia yogurt with bacteria.  Plan to follow up with Dr. Sondra Come in March as scheduled.  Please call [phone] in May or June 2018 to schedule an appointment with Dr. Skeet Latch.

## 2016-07-07 NOTE — Telephone Encounter (Signed)
Left message for Dr. Jerrol Banana RN about patient's recent sodium level and with questions about the potential need to recheck C diff.  Advised to please reach out to the son to address these concerns or to call our office.

## 2016-09-14 ENCOUNTER — Encounter: Payer: Self-pay | Admitting: Radiation Oncology

## 2016-09-14 ENCOUNTER — Ambulatory Visit
Admission: RE | Admit: 2016-09-14 | Discharge: 2016-09-14 | Disposition: A | Discharge status: Home / Self Care | Payer: Medicare Other | Source: Ambulatory Visit | Attending: Radiation Oncology | Admitting: Radiation Oncology

## 2016-09-14 VITALS — BP 144/85 | HR 62 | Temp 98.2°F | Ht 63.0 in | Wt 144.0 lb

## 2016-09-14 DIAGNOSIS — C55 Malignant neoplasm of uterus, part unspecified: Secondary | ICD-10-CM | POA: Insufficient documentation

## 2016-09-14 DIAGNOSIS — Z950 Presence of cardiac pacemaker: Secondary | ICD-10-CM | POA: Insufficient documentation

## 2016-09-14 NOTE — Progress Notes (Signed)
Kaylee Pugh is here for follow up.  She reports having some skin irritation in her vaginal and rectal area.  She reports having vaginal cellulitis in December which has cleared up.  She denies having bladder/bowel issues.  She does report having hemorrhoids.  She denies having any rectal or vaginal bleeding.  She is not using a vaginal dilator.  She denies having fatigue.  BP (!) 144/85 (BP Location: Right Arm, Patient Position: Sitting)   Pulse 62   Temp 98.2 F (36.8 C) (Oral)   Ht 5\' 3"  (1.6 m)   Wt 144 lb (65.3 kg)   SpO2 99%   BMI 25.51 kg/m    Wt Readings from Last 3 Encounters:  09/14/16 144 lb (65.3 kg)  07/07/16 151 lb 3.2 oz (68.6 kg)  04/27/16 147 lb 11.2 oz (67 kg)

## 2016-09-14 NOTE — Progress Notes (Signed)
Radiation Oncology         [phone] ________________________________  Name: Kaylee Pugh MRN: [id number]  Date: 09/14/2016  DOB: 01-Oct-1924  Follow-Up Visit Note  CC: No primary care provider on file.  Aldean Ast, PA-C  Diagnosis:  Uterine carcinosarcoma   Primary site: Corpus Uteri - Adenosarcoma   Staging method: AJCC 7th Edition   Clinical free text: IIIC1   Clinical: (T1c, N1, M0)   Pathologic free text: IIIC1   Pathologic: (T1c, N1, M0)   Summary: (T1c, N1, M0)          ICD-9-CM ICD-10-CM   1. Uterine carcinosarcoma (HCC) 179 C55     Interval Since Last Radiation: 4 years and 6 months  03/12/12, 03/19/12, 03/26/12: Intracavitary brachii therapy treatments. The vaginal cuff area was boosted further to a cumulative dose of 6300 cGy with high-dose-rate brachytherapy using iridium 192.  01/30/2012 through 03/05/2012 at [place], [place]: External beam radiation therapy. Pelvis 4500 cGy in 25 fractions  Narrative:  The patient returns today for routine follow-up. She reports some skin irritation in her vaginal and rectal area. She reports vulvar cellulitis in December Requiring hospitalization which has since cleared. She denies bladder/bowel issues. She notes positive for hemorrhoids. She denies rectal or vaginal bleeding. She is not using a vaginal dilator. She denies fatigue.  ALLERGIES:  is allergic to iodine.  Meds: Current Outpatient Prescriptions  Medication Sig Dispense Refill  . B Complex-Folic Acid (B COMPLEX-VITAMIN B12) TABS Take 1 tablet by mouth daily. 100 tablet prn  . Calcium Carbonate-Vitamin D (CALCIUM + D) 600-200 MG-UNIT TABS Take 2 tablets by mouth at bedtime.     Marland Kitchen CARTIA XT 120 MG 24 hr capsule     . furosemide (LASIX) 20 MG tablet Take 40 mg by mouth daily.     Marland Kitchen gabapentin (NEURONTIN) 300 MG capsule Take 300 mg by mouth 3 (three) times daily.     . Ipratropium-Albuterol (COMBIVENT RESPIMAT) 20-100 MCG/ACT AERS respimat Inhale 1 puff into the lungs  every 6 (six) hours.    Marland Kitchen latanoprost (XALATAN) 0.005 % ophthalmic solution 1 drop at bedtime.    Marland Kitchen levothyroxine (SYNTHROID, LEVOTHROID) 50 MCG tablet Take 50 mcg by mouth daily.    . Multiple Vitamins-Minerals (VISION-VITE PRESERVE PO) Take by mouth.    Marland Kitchen omeprazole (PRILOSEC) 20 MG capsule Take 20 mg by mouth Daily.    . Probiotic Product ([place]) CAPS Take 1 capsule by mouth daily.    . Rivaroxaban (XARELTO) 20 MG TABS tablet Take 15 mg by mouth daily with supper.     Marland Kitchen acetaminophen (TYLENOL) 500 MG tablet Take 500 mg by mouth every 6 (six) hours as needed. Pain    . diclofenac sodium (VOLTAREN) 1 % GEL     . LUMIGAN 0.01 % SOLN     . PROCTO-MED HC 2.5 % rectal cream     . tolvaptan (SAMSCA) 15 MG TABS tablet Take 15 mg by mouth daily.     No current facility-administered medications for this encounter.     Physical Findings: The patient is in no acute distress. Patient is alert and oriented.  height is 5\' 3"  (1.6 m) and weight is 144 lb (65.3 kg). Her oral temperature is 98.2 F (36.8 C). Her blood pressure is 144/85 (abnormal) and her pulse is 62. Her oxygen saturation is 99%.  No palpable supraclavicular or axillary adenopathy. The lungs are clear to auscultation.  (pacemaker in place in the left upper chest).  On  pelvic examination the external genitalia were unremarkable. Irritation noted in the perirectal area without signs of bleeding. A speculum exam was performed. There are no mucosal lesions noted in the vaginal vault.. On bimanual and rectovaginal examination there were no pelvic masses appreciated. No signs of infection along vulva at this time. Erythema without signs of lesions or infection along the perianal area.  Lab Findings: Lab Results  Component Value Date   WBC 9.2 03/04/2015   HGB 11.5 (L) 03/04/2015   HCT 35.9 03/04/2015   MCV 78.6 (L) 03/04/2015   PLT 214 03/04/2015    Radiographic Findings: No results found.  Impression:  No evidence of  recurrence on clinical exam today.   Plan:  Patient is released from follow up since it has been ~ 5 years since treatment. We have given the patient a sitz bath for perirectal irritation. If this does not resolve, she will follow up with her PCP. She will follow up with Dr. Skeet Latch in October and the patient will likely have a pap smear at that time.  ____________________________________ -----------------------------------  Blair Promise, PhD, MD   This document serves as a record of services personally performed by Gery Pray, MD. It was created on his behalf by Bethann Humble, a trained medical scribe. The creation of this record is based on the scribe's personal observations and the provider's statements to them. This document has been checked and approved by the attending provider.

## 2016-10-09 NOTE — Addendum Note (Signed)
Encounter addended by: Jacqulyn Liner, RN on: 10/09/2016  7:58 AM<BR>    Actions taken: Charge Capture section accepted

## 2018-05-10 DEATH — deceased
